# Patient Record
Sex: Male | Born: 1970
Health system: Southern US, Community
[De-identification: ages and names within clinical notes are randomized; demographics above are authoritative.]

## PROBLEM LIST (undated history)

## (undated) DIAGNOSIS — M519 Unspecified thoracic, thoracolumbar and lumbosacral intervertebral disc disorder: Secondary | ICD-10-CM

## (undated) DIAGNOSIS — E119 Type 2 diabetes mellitus without complications: Secondary | ICD-10-CM

## (undated) DIAGNOSIS — J45909 Unspecified asthma, uncomplicated: Secondary | ICD-10-CM

## (undated) DIAGNOSIS — E669 Obesity, unspecified: Secondary | ICD-10-CM

## (undated) DIAGNOSIS — I10 Essential (primary) hypertension: Secondary | ICD-10-CM

## (undated) HISTORY — DX: Unspecified thoracic, thoracolumbar and lumbosacral intervertebral disc disorder: M51.9

## (undated) HISTORY — DX: Type 2 diabetes mellitus without complications: E11.9

## (undated) HISTORY — DX: Obesity, unspecified: E66.9

## (undated) HISTORY — DX: Unspecified asthma, uncomplicated: J45.909

## (undated) HISTORY — DX: Essential (primary) hypertension: I10

## (undated) HISTORY — PX: EYE SURGERY: SHX253

---

## 2005-03-31 ENCOUNTER — Ambulatory Visit: Payer: Self-pay | Admitting: Family Medicine

## 2006-05-06 ENCOUNTER — Ambulatory Visit: Payer: Self-pay | Admitting: Family Medicine

## 2006-07-24 ENCOUNTER — Ambulatory Visit: Payer: Self-pay | Admitting: Family Medicine

## 2006-08-26 ENCOUNTER — Ambulatory Visit: Payer: Self-pay | Admitting: Family Medicine

## 2007-03-02 DIAGNOSIS — M519 Unspecified thoracic, thoracolumbar and lumbosacral intervertebral disc disorder: Secondary | ICD-10-CM

## 2007-03-02 HISTORY — DX: Unspecified thoracic, thoracolumbar and lumbosacral intervertebral disc disorder: M51.9

## 2007-05-12 ENCOUNTER — Ambulatory Visit: Payer: Self-pay | Admitting: Family Medicine

## 2007-05-12 LAB — CONVERTED CEMR LAB
Albumin: 4 g/dL (ref 3.5–5.2)
Basophils Absolute: 0 10*3/uL (ref 0.0–0.1)
Bilirubin, Direct: 0.1 mg/dL (ref 0.0–0.3)
Creatinine, Ser: 1.2 mg/dL (ref 0.4–1.5)
Direct LDL: 118.3 mg/dL
Eosinophils Absolute: 0.3 10*3/uL (ref 0.0–0.6)
HCT: 43.8 % (ref 39.0–52.0)
Hemoglobin: 14.9 g/dL (ref 13.0–17.0)
Hgb A1c MFr Bld: 5.1 % (ref 4.6–6.0)
MCHC: 33.9 g/dL (ref 30.0–36.0)
MCV: 93.1 fL (ref 78.0–100.0)
Monocytes Absolute: 0.8 10*3/uL — ABNORMAL HIGH (ref 0.2–0.7)
Neutrophils Relative %: 64.8 % (ref 43.0–77.0)
Potassium: 3.7 meq/L (ref 3.5–5.1)
Sodium: 136 meq/L (ref 135–145)
Total Bilirubin: 0.8 mg/dL (ref 0.3–1.2)

## 2007-05-27 ENCOUNTER — Encounter: Payer: Self-pay | Admitting: Family Medicine

## 2007-05-27 DIAGNOSIS — I10 Essential (primary) hypertension: Secondary | ICD-10-CM | POA: Insufficient documentation

## 2008-02-16 DIAGNOSIS — M5137 Other intervertebral disc degeneration, lumbosacral region: Secondary | ICD-10-CM | POA: Insufficient documentation

## 2008-02-18 ENCOUNTER — Ambulatory Visit: Payer: Self-pay | Admitting: Family Medicine

## 2008-02-21 ENCOUNTER — Ambulatory Visit: Payer: Self-pay | Admitting: Family Medicine

## 2008-02-28 ENCOUNTER — Ambulatory Visit: Payer: Self-pay | Admitting: Family Medicine

## 2008-03-07 ENCOUNTER — Encounter: Admission: RE | Admit: 2008-03-07 | Discharge: 2008-03-07 | Payer: Self-pay | Admitting: Family Medicine

## 2008-03-07 ENCOUNTER — Telehealth (INDEPENDENT_AMBULATORY_CARE_PROVIDER_SITE_OTHER): Payer: Self-pay | Admitting: *Deleted

## 2008-10-03 ENCOUNTER — Ambulatory Visit: Payer: Self-pay | Admitting: Family Medicine

## 2008-10-03 DIAGNOSIS — E663 Overweight: Secondary | ICD-10-CM | POA: Insufficient documentation

## 2009-05-30 ENCOUNTER — Ambulatory Visit: Payer: Self-pay | Admitting: Family Medicine

## 2009-05-30 DIAGNOSIS — J309 Allergic rhinitis, unspecified: Secondary | ICD-10-CM | POA: Insufficient documentation

## 2009-11-17 IMAGING — CR DG LUMBAR SPINE COMPLETE 4+V
6 series · 6 of 6 positions shown · non-contrast
Comparison: None

CLINICAL DATA: Low back pain radiating down left leg

LUMBAR SPINE - COMPLETE 4+ VIEW

[view not recorded (1 of 6)]
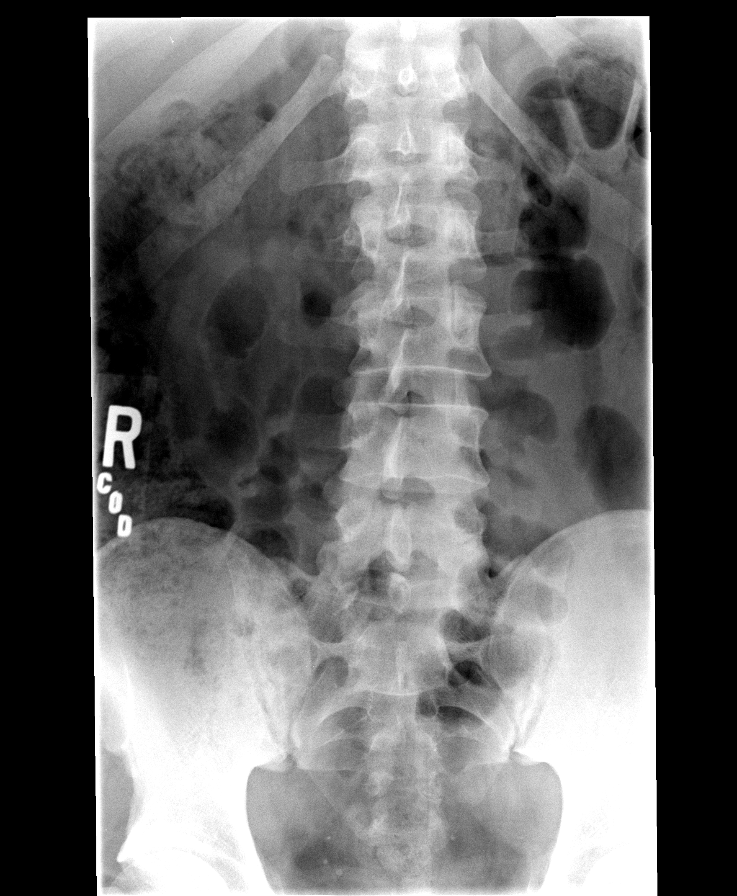

[view not recorded (2 of 6)]
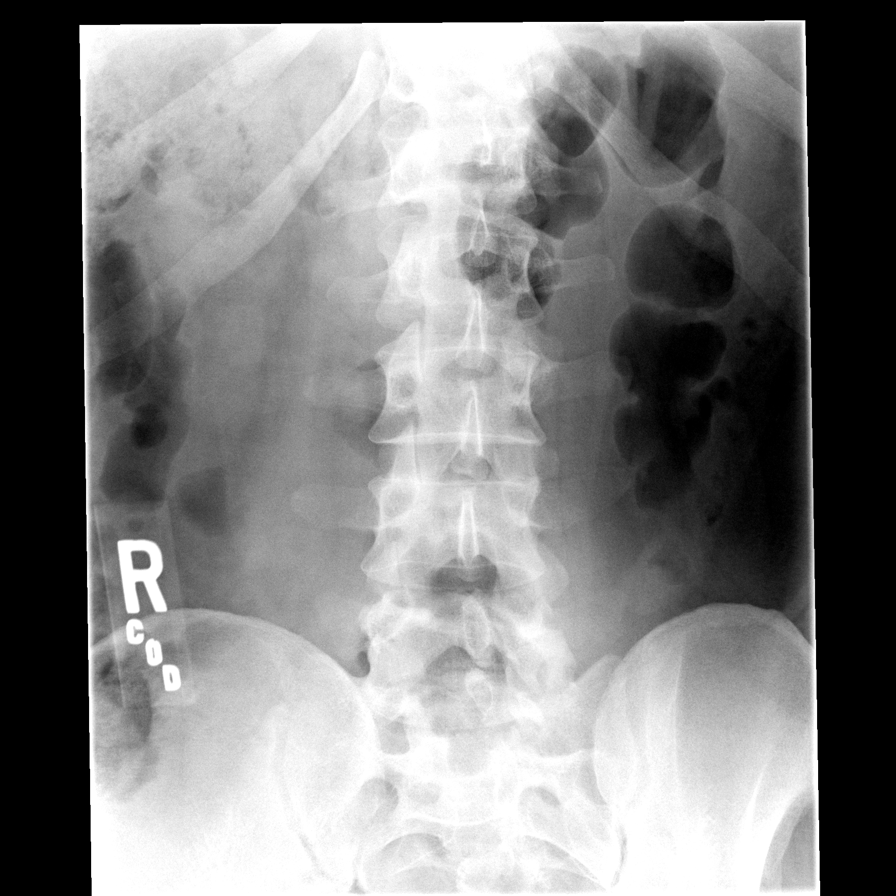

[view not recorded (3 of 6)]
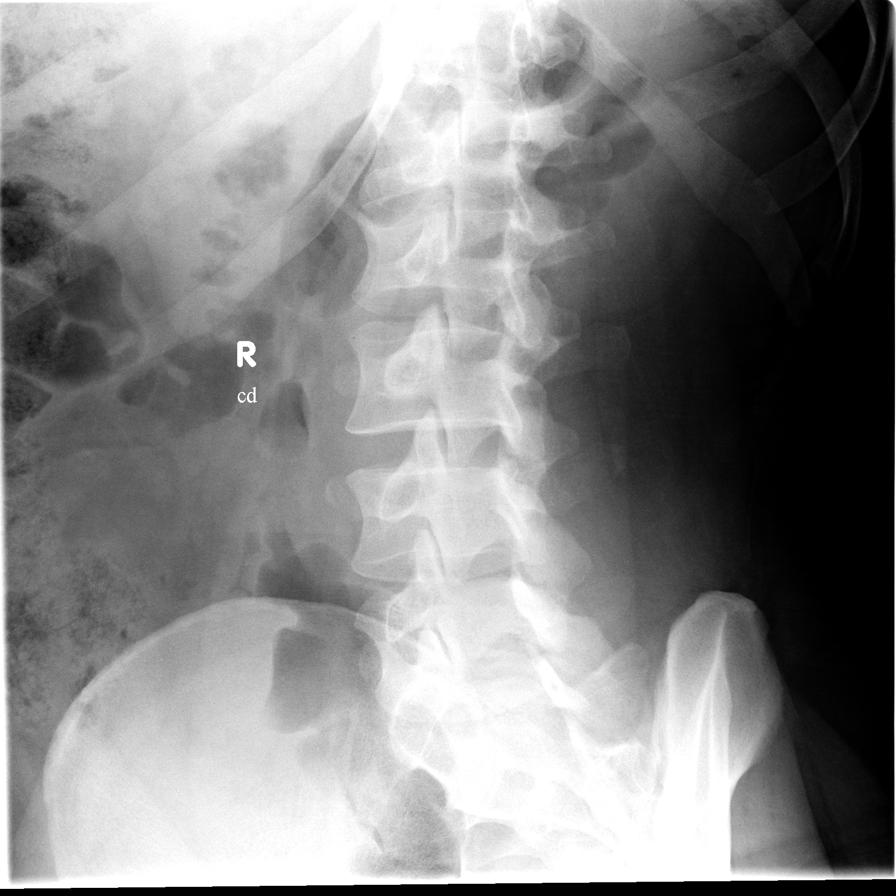

[view not recorded (4 of 6)]
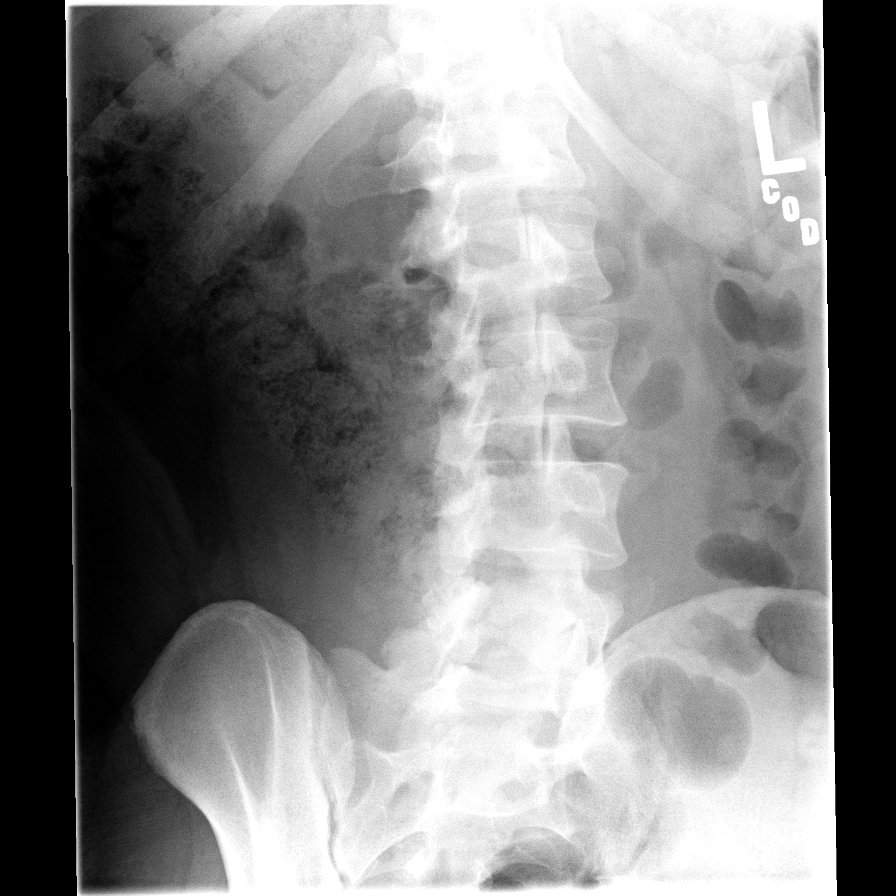

[view not recorded (5 of 6)]
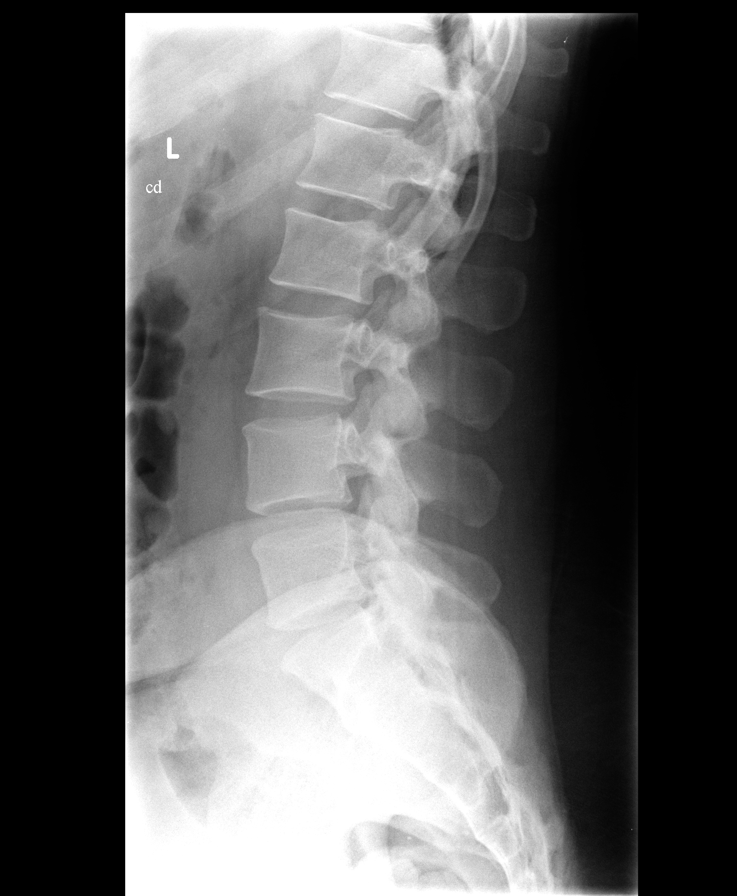

[view not recorded (6 of 6)]
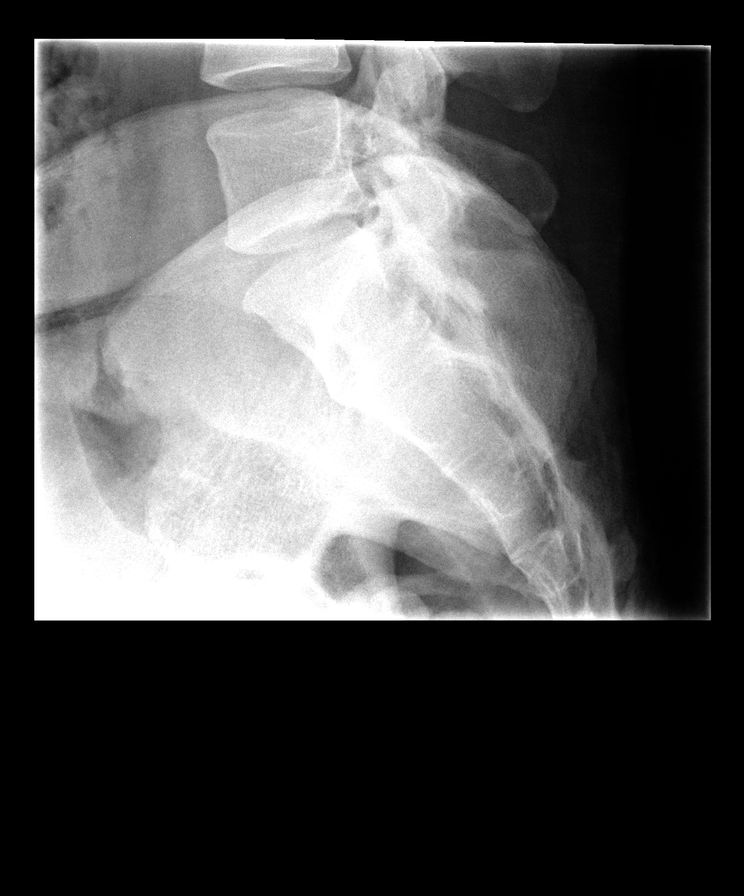

[6 of 6 positions shown; findings below may reference images not displayed]

FINDINGS: The lumbar vertebral are in normal alignment with normal
intervertebral disc spaces.  No compression deformity is seen.  No
significant degenerative change is noted.  The SI joints appear
normal.
IMPRESSION: Negative lumbar spine.  Normal alignment.

## 2010-01-04 ENCOUNTER — Ambulatory Visit: Payer: Self-pay | Admitting: Family Medicine

## 2010-12-29 LAB — CONVERTED CEMR LAB
ALT: 18 units/L (ref 0–53)
AST: 21 units/L (ref 0–37)
Basophils Relative: 0.7 % (ref 0.0–3.0)
CO2: 33 meq/L — ABNORMAL HIGH (ref 19–32)
Calcium: 9.2 mg/dL (ref 8.4–10.5)
Chloride: 101 meq/L (ref 96–112)
Cholesterol: 178 mg/dL (ref 0–200)
Creatinine, Ser: 1 mg/dL (ref 0.4–1.5)
Eosinophils Relative: 2.3 % (ref 0.0–5.0)
Glucose, Bld: 99 mg/dL (ref 70–99)
Hemoglobin: 15.2 g/dL (ref 13.0–17.0)
Hgb A1c MFr Bld: 4.9 % (ref 4.6–6.0)
LDL Cholesterol: 118 mg/dL — ABNORMAL HIGH (ref 0–99)
Lymphocytes Relative: 22.1 % (ref 12.0–46.0)
Monocytes Relative: 8.2 % (ref 3.0–12.0)
Neutro Abs: 6.1 10*3/uL (ref 1.4–7.7)
Nitrite: NEGATIVE
RBC: 4.72 M/uL (ref 4.22–5.81)
TSH: 0.88 microintl units/mL (ref 0.35–5.50)
Total CHOL/HDL Ratio: 5
Total Protein: 8.1 g/dL (ref 6.0–8.3)
Urobilinogen, UA: 4
WBC Urine, dipstick: NEGATIVE
WBC: 9.1 10*3/uL (ref 4.5–10.5)

## 2011-01-02 NOTE — Assessment & Plan Note (Signed)
Summary: cough/chest congestion/fever/per rachel/cjr   Vital Signs:  Patient profile:   40 year old male Weight:      318 pounds Temp:     98.8 degrees F oral BP sitting:   130 / 90  (left arm) Cuff size:   large  Vitals Entered By: Kern Reap CMA Duncan Dull) (January 04, 2010 3:05 PM)  Reason for Visit congestion for one week  History of Present Illness: Jared Conner is a 40 year old single male, nonsmoker, who works at the call.  [employer] who comes in today with a week's history of head congestion, sore throat, and cough.  12 point review of systems negative  Allergies: 1)  ! * Dairy  Past History:  Past medical history reviewed for relevance to current acute and chronic problems.  Past Medical History: Reviewed history from 10/03/2008 and no changes required. Hypertension OBESE ruptured disk, L5-S1 central April 2008 nonsurgical treatment  Social History: Reviewed history from 10/03/2008 and no changes required. Occupation: call center at [employer] Single Never Smoked Alcohol use-no Drug use-no Regular exercise-no  Review of Systems      See HPI  Physical Exam  General:  Well-developed,well-nourished,in no acute distress; alert,appropriate and cooperative throughout examination Head:  Normocephalic and atraumatic without obvious abnormalities. No apparent alopecia or balding. Eyes:  No corneal or conjunctival inflammation noted. EOMI. Perrla. Funduscopic exam benign, without hemorrhages, exudates or papilledema. Vision grossly normal. Ears:  External ear exam shows no significant lesions or deformities.  Otoscopic examination reveals clear canals, tympanic membranes are intact bilaterally without bulging, retraction, inflammation or discharge. Hearing is grossly normal bilaterally. Nose:  External nasal examination shows no deformity or inflammation. Nasal mucosa are pink and moist without lesions or exudates. Mouth:  Oral mucosa and oropharynx without  lesions or exudates.  Teeth in good repair. Neck:  No deformities, masses, or tenderness noted. Chest Wall:  No deformities, masses, tenderness or gynecomastia noted. Breasts:  No masses or gynecomastia noted Lungs:  Normal respiratory effort, chest expands symmetrically. Lungs are clear to auscultation, no crackles or wheezes.   Problems:  Medical Problems Added: 1)  Dx of Viral Infection-unspec  (ICD-079.99)  Impression & Recommendations:  Problem # 1:  VIRAL INFECTION-UNSPEC (ICD-079.99) Assessment New  His updated medication list for this problem includes:    Hydromet 5-1.5 Mg/77ml Syrp (Hydrocodone-homatropine) .Marland Kitchen... 1 or 2 tsps three times a day as needed  Complete Medication List: 1)  Hydrochlorothiazide 25 Mg Tabs (Hydrochlorothiazide) .... Once daily 2)  Zestril 10 Mg Tabs (Lisinopril) .... Take 1 tablet by mouth once a day 3)  Hydromet 5-1.5 Mg/29ml Syrp (Hydrocodone-homatropine) .Marland Kitchen.. 1 or 2 tsps three times a day as needed  Patient Instructions: 1)  drink 30 ounces of water daily, run a vaporizer or humidifier in your bedroom at night.  u  may take one or 2 teaspoons of Hydromet, up to 3 times a day as needed for cough and cold.  Return p.r.n. Prescriptions: HYDROMET 5-1.5 MG/5ML SYRP (HYDROCODONE-HOMATROPINE) 1 or 2 tsps three times a day as needed  #8oz x 1   Entered and Authorized by:   Roderick Pee MD   Signed by:   Roderick Pee MD on 01/04/2010   Method used:   Print then Give to Patient   RxID:   [id number]

## 2011-02-28 ENCOUNTER — Other Ambulatory Visit: Payer: Self-pay | Admitting: Family Medicine

## 2011-03-26 ENCOUNTER — Encounter: Payer: Self-pay | Admitting: Family Medicine

## 2011-03-27 ENCOUNTER — Encounter: Payer: Self-pay | Admitting: Family Medicine

## 2011-03-27 ENCOUNTER — Ambulatory Visit (INDEPENDENT_AMBULATORY_CARE_PROVIDER_SITE_OTHER): Payer: 59 | Admitting: Family Medicine

## 2011-03-27 VITALS — BP 110/78 | Temp 98.6°F | Ht 74.0 in

## 2011-03-27 DIAGNOSIS — I10 Essential (primary) hypertension: Secondary | ICD-10-CM

## 2011-03-27 NOTE — Progress Notes (Signed)
  Subjective:    Patient ID: Jared Conner, male    DOB: 18-Aug-1971, 40 y.o.   MRN: [id number]  HPICarlton is a 40 year old male, who comes in today for reevaluation of hypertension.  We started him on hydrochlorothiazide 25 mg daily and lisinopril 10 mg daily BP now down to 110/78.  No side effects from medication    Review of Systems    General and cardiovascular his systems otherwise negative Objective:   Physical Exam    Well-developed well-nourished, male in no acute distress    Assessment & Plan:  Hypertension under good control with current medication,,,,,,, BP check weekly,,,,,,,, return in August for your annual exam and sooner if any problems

## 2011-04-03 ENCOUNTER — Other Ambulatory Visit: Payer: Self-pay | Admitting: Family Medicine

## 2011-07-21 ENCOUNTER — Other Ambulatory Visit: Payer: 59

## 2011-07-22 ENCOUNTER — Other Ambulatory Visit: Payer: Self-pay | Admitting: Family Medicine

## 2011-07-28 ENCOUNTER — Encounter: Payer: 59 | Admitting: Family Medicine

## 2011-09-02 ENCOUNTER — Other Ambulatory Visit: Payer: 59

## 2011-09-09 ENCOUNTER — Encounter: Payer: 59 | Admitting: Family Medicine

## 2011-10-31 ENCOUNTER — Other Ambulatory Visit (INDEPENDENT_AMBULATORY_CARE_PROVIDER_SITE_OTHER): Payer: 59

## 2011-10-31 DIAGNOSIS — Z0000 Encounter for general adult medical examination without abnormal findings: Secondary | ICD-10-CM

## 2011-10-31 LAB — CBC WITH DIFFERENTIAL/PLATELET
Basophils Absolute: 0 10*3/uL (ref 0.0–0.1)
Basophils Relative: 0.5 % (ref 0.0–3.0)
Eosinophils Absolute: 0.2 10*3/uL (ref 0.0–0.7)
Lymphocytes Relative: 20 % (ref 12.0–46.0)
MCHC: 33.6 g/dL (ref 30.0–36.0)
MCV: 94.3 fl (ref 78.0–100.0)
Monocytes Absolute: 0.6 10*3/uL (ref 0.1–1.0)
Neutrophils Relative %: 71.2 % (ref 43.0–77.0)
Platelets: 299 10*3/uL (ref 150.0–400.0)
RBC: 4.24 Mil/uL (ref 4.22–5.81)
WBC: 9.6 10*3/uL (ref 4.5–10.5)

## 2011-10-31 LAB — BASIC METABOLIC PANEL
BUN: 12 mg/dL (ref 6–23)
CO2: 33 mEq/L — ABNORMAL HIGH (ref 19–32)
Calcium: 8.7 mg/dL (ref 8.4–10.5)
Creatinine, Ser: 1.1 mg/dL (ref 0.4–1.5)

## 2011-10-31 LAB — LIPID PANEL
Cholesterol: 190 mg/dL (ref 0–200)
HDL: 36.2 mg/dL — ABNORMAL LOW (ref >39.00)
LDL Cholesterol: 116 mg/dL — ABNORMAL HIGH (ref 0–99)
Total CHOL/HDL Ratio: 5
Triglycerides: 188 mg/dL — ABNORMAL HIGH (ref 0.0–149.0)
VLDL: 37.6 mg/dL (ref 0.0–40.0)

## 2011-10-31 LAB — HEPATIC FUNCTION PANEL
Alkaline Phosphatase: 59 U/L (ref 39–117)
Bilirubin, Direct: 0.1 mg/dL (ref 0.0–0.3)
Total Bilirubin: 0.7 mg/dL (ref 0.3–1.2)
Total Protein: 7.4 g/dL (ref 6.0–8.3)

## 2011-10-31 LAB — POCT URINALYSIS DIPSTICK
Blood, UA: NEGATIVE
Ketones, UA: NEGATIVE
Spec Grav, UA: 1.02
Urobilinogen, UA: >=8
pH, UA: 7

## 2011-11-18 ENCOUNTER — Ambulatory Visit (INDEPENDENT_AMBULATORY_CARE_PROVIDER_SITE_OTHER): Payer: 59 | Admitting: Family Medicine

## 2011-11-18 ENCOUNTER — Encounter: Payer: Self-pay | Admitting: Family Medicine

## 2011-11-18 DIAGNOSIS — I10 Essential (primary) hypertension: Secondary | ICD-10-CM

## 2011-11-18 DIAGNOSIS — R7301 Impaired fasting glucose: Secondary | ICD-10-CM

## 2011-11-18 DIAGNOSIS — J309 Allergic rhinitis, unspecified: Secondary | ICD-10-CM

## 2011-11-18 DIAGNOSIS — E663 Overweight: Secondary | ICD-10-CM

## 2011-11-18 MED ORDER — LISINOPRIL-HYDROCHLOROTHIAZIDE 10-12.5 MG PO TABS: 1.0000 | ORAL_TABLET | Freq: Every day | ORAL | Status: DC

## 2011-11-18 NOTE — Progress Notes (Signed)
  Subjective:    Patient ID: Jared Conner, male    DOB: 12-31-1970, 40 y.o.   MRN: [id number]  HPI Jared Conner is a 40 year old, married male, nonsmoker, who comes in today for evaluation of obesity, and hypertension.  His weight is 292 pounds.  He has a sedentary IT job with [employer] and does not exercise on a regular basis.  He was encouraged to start an exercise program.  He takes lisinopril 10 mg and 25 mg of HCTZ for hypertension.  BP 110/80   Review of Systems  Constitutional: Negative.   HENT: Negative.   Eyes: Negative.   Respiratory: Negative.   Cardiovascular: Negative.   Gastrointestinal: Negative.   Genitourinary: Negative.   Musculoskeletal: Negative.   Skin: Negative.   Neurological: Negative.   Hematological: Negative.   Psychiatric/Behavioral: Negative.        Objective:   Physical Exam  Constitutional: He is oriented to person, place, and time. He appears well-developed and well-nourished.  HENT:  Head: Normocephalic and atraumatic.  Right Ear: External ear normal.  Left Ear: External ear normal.  Nose: Nose normal.  Mouth/Throat: Oropharynx is clear and moist.  Eyes: Conjunctivae and EOM are normal. Pupils are equal, round, and reactive to light.  Neck: Normal range of motion. Neck supple. No JVD present. No tracheal deviation present. No thyromegaly present.  Cardiovascular: Normal rate, regular rhythm, normal heart sounds and intact distal pulses.  Exam reveals no gallop and no friction rub.   No murmur heard. Pulmonary/Chest: Effort normal and breath sounds normal. No stridor. No respiratory distress. He has no wheezes. He has no rales. He exhibits no tenderness.  Abdominal: Soft. Bowel sounds are normal. He exhibits no distension and no mass. There is no tenderness. There is no rebound and no guarding.  Genitourinary: Rectum normal, prostate normal and penis normal. Guaiac negative stool. No penile tenderness.  Musculoskeletal: Normal range of motion. He  exhibits no edema and no tenderness.  Lymphadenopathy:    He has no cervical adenopathy.  Neurological: He is alert and oriented to person, place, and time. He has normal reflexes. No cranial nerve deficit. He exhibits normal muscle tone.  Skin: Skin is warm and dry. No rash noted. No erythema. No pallor.  Psychiatric: He has a normal mood and affect. His behavior is normal. Judgment and thought content normal.          Assessment & Plan:  Healthy male.  Obesity.  Recommend diet and exercise and weight loss program.  Hypertension.  It goal continue current therapy.  Follow-up in one year, sooner if any problems

## 2011-11-18 NOTE — Patient Instructions (Addendum)
The new blood pressure medication has both undergo a thiazide and the lisinopril and one pill............... Therefore take one tablet daily in the morning.  Return in one year, sooner if any problems.  strongly consider a diet, exercise and weight loss program For your overall health plus, your blood sugar is now elevated  Return in June for follow-up fasting labs one week prior

## 2012-05-11 ENCOUNTER — Other Ambulatory Visit: Payer: 59

## 2012-05-18 ENCOUNTER — Ambulatory Visit: Payer: 59 | Admitting: Family Medicine

## 2012-06-10 ENCOUNTER — Other Ambulatory Visit: Payer: 59

## 2012-06-17 ENCOUNTER — Ambulatory Visit: Payer: 59 | Admitting: Family Medicine

## 2012-08-09 ENCOUNTER — Ambulatory Visit: Payer: 59 | Admitting: Family Medicine

## 2012-08-17 ENCOUNTER — Ambulatory Visit: Payer: 59 | Admitting: Family Medicine

## 2012-08-26 ENCOUNTER — Other Ambulatory Visit (INDEPENDENT_AMBULATORY_CARE_PROVIDER_SITE_OTHER): Payer: 59

## 2012-08-26 DIAGNOSIS — R7301 Impaired fasting glucose: Secondary | ICD-10-CM

## 2012-08-26 LAB — BASIC METABOLIC PANEL
BUN: 13 mg/dL (ref 6–23)
CO2: 29 mEq/L (ref 19–32)
Calcium: 9.2 mg/dL (ref 8.4–10.5)
GFR: 106.53 mL/min (ref >60.00)
Glucose, Bld: 104 mg/dL — ABNORMAL HIGH (ref 70–99)
Sodium: 138 mEq/L (ref 135–145)

## 2012-08-26 LAB — MICROALBUMIN / CREATININE URINE RATIO: Creatinine,U: 168.1 mg/dL

## 2012-09-02 ENCOUNTER — Encounter: Payer: Self-pay | Admitting: Family Medicine

## 2012-09-02 ENCOUNTER — Ambulatory Visit (INDEPENDENT_AMBULATORY_CARE_PROVIDER_SITE_OTHER): Payer: 59 | Admitting: Family Medicine

## 2012-09-02 VITALS — BP 124/90 | Temp 98.5°F | Wt 369.0 lb

## 2012-09-02 DIAGNOSIS — R7302 Impaired glucose tolerance (oral): Secondary | ICD-10-CM

## 2012-09-02 DIAGNOSIS — E663 Overweight: Secondary | ICD-10-CM

## 2012-09-02 DIAGNOSIS — R7309 Other abnormal glucose: Secondary | ICD-10-CM

## 2012-09-02 DIAGNOSIS — I10 Essential (primary) hypertension: Secondary | ICD-10-CM

## 2012-09-02 MED ORDER — LISINOPRIL-HYDROCHLOROTHIAZIDE 10-12.5 MG PO TABS: 1.0000 | ORAL_TABLET | Freq: Every day | ORAL | Status: DC

## 2012-09-02 NOTE — Progress Notes (Signed)
  Subjective:    Patient ID: Jared Conner, male    DOB: February 07, 1971, 41 y.o.   MRN: [id number]  HPI Jared Conner is a 41 year old male who comes in today for evaluation of multiple issues  He's on Zestoretic 10-12.5 daily BP 124/90  He has had a history of glucose intolerance his fasting blood sugar was 104 A1c normal 5.4 however 3 years ago was 4.9.  His weight today is up to 369 pounds. He states that our scales or 20 pounds heavier than his home scales. He's been trying to exercise by walking 30-60 minutes twice weekly  Last physical examination December 2012   Review of Systems    general and metabolic review of systems otherwise negative Objective:   Physical Exam  Well-developed and nourished man no acute distress BP right arm sitting position 124/90      Assessment & Plan:  Healthy male  Hypertension continue current medication  Glucose intolerance continued attention to diet and exercise

## 2012-09-02 NOTE — Patient Instructions (Signed)
Continue your blood pressure medication daily  Work hard on the diet and exercise program  Return in April for general physical examination  Labs one week prior

## 2013-02-24 ENCOUNTER — Other Ambulatory Visit: Payer: 59

## 2013-03-03 ENCOUNTER — Encounter: Payer: 59 | Admitting: Family Medicine

## 2013-05-17 ENCOUNTER — Other Ambulatory Visit: Payer: 59

## 2013-05-24 ENCOUNTER — Encounter: Payer: 59 | Admitting: Family Medicine

## 2013-07-12 ENCOUNTER — Other Ambulatory Visit: Payer: 59

## 2013-07-19 ENCOUNTER — Encounter: Payer: 59 | Admitting: Family Medicine

## 2013-09-02 ENCOUNTER — Other Ambulatory Visit: Payer: 59

## 2013-09-06 ENCOUNTER — Encounter: Payer: 59 | Admitting: Family Medicine

## 2013-09-29 ENCOUNTER — Other Ambulatory Visit: Payer: Self-pay | Admitting: Family Medicine

## 2013-11-07 ENCOUNTER — Other Ambulatory Visit: Payer: 59

## 2013-11-14 ENCOUNTER — Encounter: Payer: 59 | Admitting: Family Medicine

## 2014-01-19 ENCOUNTER — Other Ambulatory Visit: Payer: Self-pay | Admitting: Family Medicine

## 2014-03-08 ENCOUNTER — Other Ambulatory Visit: Payer: 59

## 2014-03-15 ENCOUNTER — Encounter: Payer: 59 | Admitting: Family Medicine

## 2014-04-21 ENCOUNTER — Other Ambulatory Visit: Payer: 59

## 2014-05-03 ENCOUNTER — Encounter: Payer: 59 | Admitting: Family Medicine

## 2014-05-08 ENCOUNTER — Other Ambulatory Visit: Payer: Self-pay | Admitting: Family Medicine

## 2014-05-14 ENCOUNTER — Other Ambulatory Visit: Payer: Self-pay | Admitting: Family Medicine

## 2014-06-19 ENCOUNTER — Other Ambulatory Visit (INDEPENDENT_AMBULATORY_CARE_PROVIDER_SITE_OTHER): Payer: 59

## 2014-06-19 DIAGNOSIS — Z0000 Encounter for general adult medical examination without abnormal findings: Secondary | ICD-10-CM

## 2014-06-19 LAB — POCT URINALYSIS DIPSTICK
BILIRUBIN UA: NEGATIVE
Blood, UA: NEGATIVE
Glucose, UA: NEGATIVE
KETONES UA: NEGATIVE
LEUKOCYTES UA: NEGATIVE
NITRITE UA: NEGATIVE
PH UA: 7
Protein, UA: NEGATIVE
Spec Grav, UA: 1.02
Urobilinogen, UA: 2

## 2014-06-19 LAB — CBC WITH DIFFERENTIAL/PLATELET
BASOS ABS: 0 10*3/uL (ref 0.0–0.1)
Basophils Relative: 0.3 % (ref 0.0–3.0)
Eosinophils Absolute: 0.3 10*3/uL (ref 0.0–0.7)
Eosinophils Relative: 2.8 % (ref 0.0–5.0)
HEMATOCRIT: 40.5 % (ref 39.0–52.0)
HEMOGLOBIN: 13.5 g/dL (ref 13.0–17.0)
Lymphocytes Relative: 18.2 % (ref 12.0–46.0)
Lymphs Abs: 2 10*3/uL (ref 0.7–4.0)
MCHC: 33.4 g/dL (ref 30.0–36.0)
MCV: 93.7 fl (ref 78.0–100.0)
MONO ABS: 0.9 10*3/uL (ref 0.1–1.0)
MONOS PCT: 8 % (ref 3.0–12.0)
NEUTROS ABS: 7.9 10*3/uL — AB (ref 1.4–7.7)
Neutrophils Relative %: 70.7 % (ref 43.0–77.0)
PLATELETS: 323 10*3/uL (ref 150.0–400.0)
RBC: 4.32 Mil/uL (ref 4.22–5.81)
RDW: 13 % (ref 11.5–15.5)
WBC: 11.2 10*3/uL — ABNORMAL HIGH (ref 4.0–10.5)

## 2014-06-19 LAB — HEPATIC FUNCTION PANEL
ALT: 17 U/L (ref 0–53)
AST: 16 U/L (ref 0–37)
Albumin: 3.8 g/dL (ref 3.5–5.2)
Alkaline Phosphatase: 62 U/L (ref 39–117)
BILIRUBIN TOTAL: 0.3 mg/dL (ref 0.2–1.2)
Bilirubin, Direct: 0.1 mg/dL (ref 0.0–0.3)
Total Protein: 7.3 g/dL (ref 6.0–8.3)

## 2014-06-19 LAB — PSA: PSA: 0.28 ng/mL (ref 0.10–4.00)

## 2014-06-19 LAB — BASIC METABOLIC PANEL
BUN: 11 mg/dL (ref 6–23)
CHLORIDE: 97 meq/L (ref 96–112)
CO2: 30 mEq/L (ref 19–32)
Calcium: 8.6 mg/dL (ref 8.4–10.5)
Creatinine, Ser: 1.1 mg/dL (ref 0.4–1.5)
GFR: 96.56 mL/min (ref >60.00)
GLUCOSE: 190 mg/dL — AB (ref 70–99)
Potassium: 4 mEq/L (ref 3.5–5.1)
SODIUM: 135 meq/L (ref 135–145)

## 2014-06-19 LAB — LIPID PANEL
CHOLESTEROL: 161 mg/dL (ref 0–200)
HDL: 32.5 mg/dL — ABNORMAL LOW (ref >39.00)
LDL Cholesterol: 71 mg/dL (ref 0–99)
NONHDL: 128.5
Total CHOL/HDL Ratio: 5
Triglycerides: 289 mg/dL — ABNORMAL HIGH (ref 0.0–149.0)
VLDL: 57.8 mg/dL — AB (ref 0.0–40.0)

## 2014-06-19 LAB — TSH: TSH: 1.99 u[IU]/mL (ref 0.35–4.50)

## 2014-06-26 ENCOUNTER — Encounter: Payer: 59 | Admitting: Family Medicine

## 2014-07-13 ENCOUNTER — Encounter: Payer: 59 | Admitting: Family Medicine

## 2014-08-03 ENCOUNTER — Ambulatory Visit (INDEPENDENT_AMBULATORY_CARE_PROVIDER_SITE_OTHER): Payer: 59 | Admitting: Family Medicine

## 2014-08-03 ENCOUNTER — Encounter: Payer: Self-pay | Admitting: Family Medicine

## 2014-08-03 VITALS — BP 120/78 | Temp 98.7°F | Ht 74.0 in | Wt 377.0 lb

## 2014-08-03 DIAGNOSIS — M5137 Other intervertebral disc degeneration, lumbosacral region: Secondary | ICD-10-CM

## 2014-08-03 DIAGNOSIS — I10 Essential (primary) hypertension: Secondary | ICD-10-CM

## 2014-08-03 DIAGNOSIS — M51379 Other intervertebral disc degeneration, lumbosacral region without mention of lumbar back pain or lower extremity pain: Secondary | ICD-10-CM

## 2014-08-03 DIAGNOSIS — E139 Other specified diabetes mellitus without complications: Secondary | ICD-10-CM | POA: Insufficient documentation

## 2014-08-03 DIAGNOSIS — IMO0001 Reserved for inherently not codable concepts without codable children: Secondary | ICD-10-CM

## 2014-08-03 DIAGNOSIS — Z23 Encounter for immunization: Secondary | ICD-10-CM

## 2014-08-03 DIAGNOSIS — E1165 Type 2 diabetes mellitus with hyperglycemia: Secondary | ICD-10-CM

## 2014-08-03 DIAGNOSIS — E663 Overweight: Secondary | ICD-10-CM

## 2014-08-03 LAB — HEMOGLOBIN A1C: HEMOGLOBIN A1C: 7.2 % — AB (ref 4.6–6.5)

## 2014-08-03 LAB — MICROALBUMIN / CREATININE URINE RATIO
CREATININE, U: 191.1 mg/dL
MICROALB UR: 0.9 mg/dL (ref 0.0–1.9)
Microalb Creat Ratio: 0.5 mg/g (ref 0.0–30.0)

## 2014-08-03 MED ORDER — METFORMIN HCL 500 MG PO TABS: ORAL_TABLET | ORAL | Status: DC

## 2014-08-03 MED ORDER — LISINOPRIL 10 MG PO TABS: 10.0000 mg | ORAL_TABLET | Freq: Every day | ORAL | Status: DC

## 2014-08-03 NOTE — Progress Notes (Signed)
   Subjective:    Patient ID: Jared Conner, male    DOB: 14-Aug-1971, 43 y.o.   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 Also his weight is up to 377 pounds. He's not been in the gym for 6 months. He would like a reevaluation of his back. Recommended Dr. Danielle Dess  He gets routine eye care, dental care, colonoscopy at age 44......... no family history colon cancer or polyps...Marland KitchenMarland KitchenMarland Kitchen mother alive and well an 63 no medical problems.......... diabetic complications of diabetes.   Review of Systems  Constitutional: Negative.   HENT: Negative.   Eyes: Negative.   Respiratory: Negative.   Cardiovascular: Negative.   Gastrointestinal: Negative.   Genitourinary: Negative.   Musculoskeletal: Negative.   Skin: Negative.   Neurological: Negative.   Psychiatric/Behavioral: Negative.        Objective:   Physical Exam  Nursing note and vitals reviewed. Constitutional: He is oriented to person, place, and time. He appears well-developed and well-nourished.  HENT:  Head: Normocephalic and atraumatic.  Right Ear: External ear normal.  Left Ear: External ear normal.  Nose: Nose normal.  Mouth/Throat: Oropharynx is clear and moist.  Eyes: Conjunctivae and EOM are normal. Pupils are equal, round, and reactive to light.  Neck: Normal range of motion. Neck supple. No JVD present. No tracheal deviation present. No thyromegaly present.  Cardiovascular: Normal rate, regular rhythm, normal heart sounds and intact distal pulses.  Exam reveals no gallop and no friction rub.   No murmur heard. Pulmonary/Chest: Effort normal and breath sounds normal. No stridor. No respiratory distress. He has no wheezes. He has no rales. He exhibits no tenderness.    Abdominal: Soft. Bowel sounds are normal. He exhibits no distension and no mass. There is no tenderness. There is no rebound and no guarding.  Genitourinary: Rectum normal, prostate normal and penis normal. Guaiac negative stool. No penile tenderness.  Musculoskeletal: Normal range of motion. He exhibits no edema and no tenderness.  Lymphadenopathy:    He has no cervical adenopathy.  Neurological: He is alert and oriented to person, place, and time. He has normal reflexes. No cranial nerve deficit. He exhibits normal muscle tone.  2+ peripheral edema  Skin: Skin is warm and dry. No rash noted. No erythema. No pallor.  Psychiatric: He has a normal mood and affect. His behavior is normal. Judgment and thought content normal.          Assessment & Plan:Obesity... obesity  ..... Obesity......... diet exercise and weight loss  Hypertension at goal continue Zestoretic 20-12.5 daily  Peripheral edema....... secondary to obesity.......... add Lasix followup in 4 weeks.Marland KitchenMarland KitchenMarland Kitchen

## 2014-08-03 NOTE — Patient Instructions (Addendum)
Metformin 500 mg........ one half tab twice daily  No carbohydrates nor fat  Walk 30 minutes daily  Check a fasting blood sugar daily in the morning  Followup in one week  Lasix 20 mg........Marland Kitchen 1 daily in the morning Lisinopril 10 mg.......Marland Kitchen 1 daily in the morning  Call Dr. Barnett Abu neurosurgeon for consultation about your back  Stop  the Zestoretic

## 2014-08-03 NOTE — Progress Notes (Signed)
Pre visit review using our clinic review tool, if applicable. No additional management support is needed unless otherwise documented below in the visit note. 

## 2014-08-10 ENCOUNTER — Encounter: Payer: Self-pay | Admitting: Family Medicine

## 2014-08-10 ENCOUNTER — Ambulatory Visit (INDEPENDENT_AMBULATORY_CARE_PROVIDER_SITE_OTHER): Payer: 59 | Admitting: Family Medicine

## 2014-08-10 VITALS — BP 110/80 | Temp 98.4°F | Wt 379.0 lb

## 2014-08-10 DIAGNOSIS — E1165 Type 2 diabetes mellitus with hyperglycemia: Secondary | ICD-10-CM

## 2014-08-10 DIAGNOSIS — H6121 Impacted cerumen, right ear: Secondary | ICD-10-CM

## 2014-08-10 DIAGNOSIS — H6120 Impacted cerumen, unspecified ear: Secondary | ICD-10-CM | POA: Insufficient documentation

## 2014-08-10 DIAGNOSIS — H918X9 Other specified hearing loss, unspecified ear: Secondary | ICD-10-CM

## 2014-08-10 DIAGNOSIS — IMO0001 Reserved for inherently not codable concepts without codable children: Secondary | ICD-10-CM

## 2014-08-10 MED ORDER — ACCU-CHEK SOFTCLIX LANCET DEV MISC: Status: DC

## 2014-08-10 MED ORDER — GLUCOSE BLOOD VI STRP: ORAL_STRIP | Status: DC

## 2014-08-10 NOTE — Progress Notes (Signed)
Pre visit review using our clinic review tool, if applicable. No additional management support is needed unless otherwise documented below in the visit note. Lab Results  Component Value Date   HGBA1C 7.2* 08/03/2014   HGBA1C 5.4 08/26/2012   HGBA1C 4.9 10/03/2008   Lab Results  Component Value Date   MICROALBUR 0.9 08/03/2014   LDLCALC 71 06/19/2014   CREATININE 1.1 06/19/2014

## 2014-08-10 NOTE — Patient Instructions (Signed)
Continue current medications  Check a fasting blood sugar daily in the morning  Return to see me in 4 weeks for followup

## 2014-08-10 NOTE — Progress Notes (Signed)
   Subjective:    Patient ID: Jared Conner, male    DOB: 1971-04-29, 43 y.o.   MRN: [id number]  HPI  Jared Conner is a delightful 43 year old male who comes in today for followup of induction of metformin therapy for new-onset diabetes  We started him on metformin 250 mg twice a day blood sugars in the 120 to 1:30 range in the morning. No hypoglycemia  He also has hearing loss in his right ear he's been using the ear drops to no avail. He has a cerumen impaction  Review of Systems    review of systems negative Objective:   Physical Exam  Well-developed well-nourished male in no acute distress vital signs stable he is afebrile  Cerumen impaction right ear.........Marland Kitchen relieved by suction and irrigation      Assessment & Plan:  Diabetes type 2.......... new-onset......... no complication from therapy........ continue metformin 250 mg twice a day, diet and exercise, nutrition consults coming up  Followup with me in 4 weeks  Cerumen impaction right ear.......Marland Kitchen removed by irrigation.Marland Kitchen

## 2014-08-17 ENCOUNTER — Encounter: Payer: 59 | Attending: Family Medicine

## 2014-08-17 VITALS — Ht 73.0 in | Wt 376.9 lb

## 2014-08-17 DIAGNOSIS — Z713 Dietary counseling and surveillance: Secondary | ICD-10-CM | POA: Insufficient documentation

## 2014-08-17 DIAGNOSIS — E119 Type 2 diabetes mellitus without complications: Secondary | ICD-10-CM | POA: Insufficient documentation

## 2014-08-18 NOTE — Progress Notes (Signed)

## 2014-08-24 DIAGNOSIS — E119 Type 2 diabetes mellitus without complications: Secondary | ICD-10-CM | POA: Diagnosis not present

## 2014-08-25 NOTE — Progress Notes (Signed)

## 2014-08-31 ENCOUNTER — Encounter: Payer: 59 | Attending: Family Medicine

## 2014-08-31 DIAGNOSIS — E119 Type 2 diabetes mellitus without complications: Secondary | ICD-10-CM | POA: Insufficient documentation

## 2014-08-31 DIAGNOSIS — Z713 Dietary counseling and surveillance: Secondary | ICD-10-CM | POA: Insufficient documentation

## 2014-09-07 ENCOUNTER — Ambulatory Visit (INDEPENDENT_AMBULATORY_CARE_PROVIDER_SITE_OTHER): Payer: 59 | Admitting: Family Medicine

## 2014-09-07 ENCOUNTER — Encounter: Payer: Self-pay | Admitting: Family Medicine

## 2014-09-07 VITALS — BP 110/76 | Temp 98.5°F | Wt 369.5 lb

## 2014-09-07 DIAGNOSIS — E139 Other specified diabetes mellitus without complications: Secondary | ICD-10-CM

## 2014-09-07 NOTE — Progress Notes (Signed)
   Subjective:    Patient ID: Jared HessCarlton E Conner, male    DOB: 05/13/1971, 43 y.o.   MRN: [id number]  HPI Jared FlesherCarleton is a 43 year old male who comes in today because of new-onset diabetes  He's now on metformin 250 mg twice a day diet and exercise and has lost 10 pounds. Fasting sugars in the morning range from 100-109. No hypoglycemia  On lisinopril 10 mg BP 110/76  He's going to the diabetic nutrition counseling center.   Review of Systems    review of systems otherwise negative Objective:   Physical Exam  Well-developed and nourished male no acute distress vital signs stable he is afebrile weight down to 396 pounds BP 110/76      Assessment & Plan:  Diabetes type 2 at goal continue current therapy  Hypertension ago continue current therapy  Obesity continue diet exercise and weight loss  Followup A1c as outlined

## 2014-09-07 NOTE — Progress Notes (Signed)
Pre visit review using our clinic review tool, if applicable. No additional management support is needed unless otherwise documented below in the visit note. 

## 2014-09-07 NOTE — Patient Instructions (Signed)
Continue your current medications diet and exercise program  If you see consistently low blood sugars below 80 in the morning,,,,,,,,,, stop the evening dose of metformin but continue the morning to.  Followup in 2 months  Nonfasting labs,,,,,,,,,,, 11 AM or 4 PM,,,,,,, one week prior

## 2014-09-13 NOTE — Progress Notes (Signed)
Patient was seen on 08/31/14 for the third of a series of three diabetes self-management courses at the [facility]. The following learning objectives were met by the patient during this class:    State the amount of activity recommended for healthy living   Describe activities suitable for individual needs   Identify ways to regularly incorporate activity into daily life   Identify barriers to activity and ways to over come these barriers  Identify diabetes medications being personally used and their primary action for lowering glucose and possible side effects   Describe role of stress on blood glucose and develop strategies to address psychosocial issues   Identify diabetes complications and ways to prevent them  Explain how to manage diabetes during illness   Evaluate success in meeting personal goal   Establish 2-3 goals that they will plan to diligently work on until they return for the  104-monthfollow-up visit  Goals:   I will count my carb choices at most meals and snacks  I will be active 30 minutes or more 5 times a week  I will take my diabetes medications as scheduled  I will test my glucose at least 1 times a day, 7 days a week  Your patient has identified these potential barriers to change:  Motivation......"but I am working on it"  Your patient has identified their diabetes self-care support plan as  Family Support On-line Resources  Plan:  Attend Core 4 in 4 months

## 2014-10-25 ENCOUNTER — Other Ambulatory Visit (INDEPENDENT_AMBULATORY_CARE_PROVIDER_SITE_OTHER): Payer: 59

## 2014-10-25 DIAGNOSIS — I10 Essential (primary) hypertension: Secondary | ICD-10-CM

## 2014-10-25 DIAGNOSIS — E119 Type 2 diabetes mellitus without complications: Secondary | ICD-10-CM

## 2014-10-25 LAB — BASIC METABOLIC PANEL
BUN: 15 mg/dL (ref 6–23)
CALCIUM: 9.4 mg/dL (ref 8.4–10.5)
CO2: 27 mEq/L (ref 19–32)
CREATININE: 1.1 mg/dL (ref 0.50–1.35)
Chloride: 103 mEq/L (ref 96–112)
Glucose, Bld: 82 mg/dL (ref 70–99)
Potassium: 4.2 mEq/L (ref 3.5–5.3)
Sodium: 140 mEq/L (ref 135–145)

## 2014-10-26 LAB — HEMOGLOBIN A1C
HEMOGLOBIN A1C: 5.2 % (ref <5.7)
Mean Plasma Glucose: 103 mg/dL (ref <117)

## 2014-11-01 ENCOUNTER — Ambulatory Visit (INDEPENDENT_AMBULATORY_CARE_PROVIDER_SITE_OTHER): Payer: 59 | Admitting: Family Medicine

## 2014-11-01 ENCOUNTER — Encounter: Payer: Self-pay | Admitting: Family Medicine

## 2014-11-01 VITALS — BP 130/90 | Temp 98.5°F | Wt 362.0 lb

## 2014-11-01 DIAGNOSIS — E139 Other specified diabetes mellitus without complications: Secondary | ICD-10-CM

## 2014-11-01 NOTE — Progress Notes (Signed)
   Subjective:    Patient ID: Glean HessCarlton E Strohmeyer, male    DOB: 1971-01-30, 43 y.o.   MRN: [id number]  HPI Les PouCarlton is a 43 year old male who comes in today for evaluation diabetes  His A1c is gone over 7 in August. We increased his metformin due to 150 mg twice a day and emphasize diet exercise and weight loss. He's lost 10 pounds. A1c is down to 5.2% and fasting sugars are in the 80-100 range.  On 10 mg of lisinopril days BP today is 130/90. He'll monitor his blood pressure at home. If his diastolics remain above 90 them when increased his lisinopril to 20 mg daily   Review of Systems Review of systems otherwise negative    Objective:   Physical Exam  Well-developed well-nourished male no acute distress weight down to 362 pounds BP right arm sitting position 130/90      Assessment & Plan:  Diabetes type 2 at goal.....Marland Kitchen. continue current therapy follow-up in 3 months  Hypertension... Question goal....... BP check every morning follow-up in 2 weeks  Overweight........Marland Kitchen

## 2014-11-01 NOTE — Progress Notes (Signed)
Pre visit review using our clinic review tool, if applicable. No additional management support is needed unless otherwise documented below in the visit note. Lab Results  Component Value Date   HGBA1C 5.2 10/25/2014   HGBA1C 7.2* 08/03/2014   HGBA1C 5.4 08/26/2012   Lab Results  Component Value Date   MICROALBUR 0.9 08/03/2014   LDLCALC 71 06/19/2014   CREATININE 1.10 10/25/2014

## 2014-11-01 NOTE — Patient Instructions (Addendum)
Continue current medication  Continue diet and exercise program  Follow-up in 3 months  Nonfasting labs one week prior  Check your blood pressure daily in the morning for 2 weeks....... BP goal 135/85 or less..... If your blood pressure diastolic continues to be above 85 then increase your lisinopril to 20 mg daily call and leave a voicemail with Fleet ContrasRachel so we can increase your prescription

## 2014-12-04 ENCOUNTER — Telehealth: Payer: Self-pay | Admitting: Family Medicine

## 2014-12-04 NOTE — Telephone Encounter (Signed)
Pt would like to bring his bp machine in  and compare reading to our machine.

## 2014-12-05 NOTE — Telephone Encounter (Signed)
Left message on machine for patient to call back and bring his machine on Friday.

## 2014-12-07 NOTE — Telephone Encounter (Signed)
S/w pt he will come in 12/08/14

## 2014-12-08 ENCOUNTER — Ambulatory Visit (INDEPENDENT_AMBULATORY_CARE_PROVIDER_SITE_OTHER): Payer: 59 | Admitting: *Deleted

## 2014-12-08 ENCOUNTER — Encounter: Payer: Self-pay | Admitting: *Deleted

## 2014-12-08 VITALS — BP 130/94

## 2014-12-08 DIAGNOSIS — E139 Other specified diabetes mellitus without complications: Secondary | ICD-10-CM

## 2014-12-27 ENCOUNTER — Other Ambulatory Visit: Payer: 59

## 2015-01-01 ENCOUNTER — Ambulatory Visit: Payer: 59

## 2015-01-25 ENCOUNTER — Other Ambulatory Visit: Payer: 59

## 2015-01-31 ENCOUNTER — Other Ambulatory Visit (INDEPENDENT_AMBULATORY_CARE_PROVIDER_SITE_OTHER): Payer: 59

## 2015-01-31 DIAGNOSIS — E139 Other specified diabetes mellitus without complications: Secondary | ICD-10-CM

## 2015-01-31 LAB — BASIC METABOLIC PANEL
BUN: 16 mg/dL (ref 6–23)
CO2: 30 mEq/L (ref 19–32)
Calcium: 9.4 mg/dL (ref 8.4–10.5)
Chloride: 104 mEq/L (ref 96–112)
Creatinine, Ser: 1 mg/dL (ref 0.40–1.50)
GFR: 104.11 mL/min (ref >60.00)
Glucose, Bld: 86 mg/dL (ref 70–99)
POTASSIUM: 4.2 meq/L (ref 3.5–5.1)
SODIUM: 139 meq/L (ref 135–145)

## 2015-01-31 LAB — HEMOGLOBIN A1C: HEMOGLOBIN A1C: 4.7 % (ref 4.6–6.5)

## 2015-02-01 ENCOUNTER — Ambulatory Visit: Payer: 59 | Admitting: Family Medicine

## 2015-02-07 ENCOUNTER — Encounter: Payer: Self-pay | Admitting: Family Medicine

## 2015-02-07 ENCOUNTER — Ambulatory Visit (INDEPENDENT_AMBULATORY_CARE_PROVIDER_SITE_OTHER): Payer: 59 | Admitting: Family Medicine

## 2015-02-07 VITALS — BP 120/88 | Temp 98.3°F | Wt 346.0 lb

## 2015-02-07 DIAGNOSIS — E139 Other specified diabetes mellitus without complications: Secondary | ICD-10-CM

## 2015-02-07 NOTE — Patient Instructions (Signed)
Metformin 500 mg,,,,,,,, one half tab daily  Continue diet and exercise program  Return in September for complete physical examination  Fasting labs one week prior

## 2015-02-07 NOTE — Progress Notes (Signed)
Pre visit review using our clinic review tool, if applicable. No additional management support is needed unless otherwise documented below in the visit note. 

## 2015-02-07 NOTE — Progress Notes (Signed)
   Subjective:    Patient ID: Jared Conner, male    DOB: 08-20-1971, 44 y.o.   MRN: [id number]  HPI Jared Conner is a 44 year old male nonsmoker who comes in today for follow-up of diabetes  This winter he started a diet and exercise program he's lost 30 some pounds. His blood sugar now is in the mid 80s A1c has dropped to 4.7% on 250 mg of metformin twice a day   Review of Systems review of systems negative no hypoglycemia    Objective:   Physical Exam   well-developed well-nourished male no acute distress vital signs stable he is afebrile weight down to 346 pounds  BP 120/80       Assessment & Plan:   hypertension at goal ........ continue current medication  Diabetes markedly improved with weight loss ....... decrease metformin to a half a pill once a day follow-up in September

## 2015-05-03 ENCOUNTER — Telehealth: Payer: Self-pay | Admitting: Family Medicine

## 2015-05-03 NOTE — Telephone Encounter (Signed)
Has an appt with Kandee Keenory on 05/04/15

## 2015-05-03 NOTE — Telephone Encounter (Signed)
[facility] - Client TELEPHONE ADVICE RECORD [facility]  Patient Name: Jared Conner  DOB: 02/23/1971    Initial Comment Caller states his BP is 174/115.   Nurse Assessment  Nurse: Phylliss Bobowe, RN, Synetta FailAnita Date/Time (Eastern Time): 05/03/2015 4:33:47 PM  Confirm and document reason for call. If symptomatic, describe symptoms. ---Caller states his BP is 174/115. that was this AM and has not rechecked his BP further and has had no headache and has been having headaches in the past few weeks  Has the patient traveled out of the country within the last 30 days? ---No  Does the patient require triage? ---Yes  Related visit to physician within the last 2 weeks? ---No  Does the PT have any chronic conditions? (i.e. diabetes, asthma, etc.) ---Yes  List chronic conditions. ---HTN diabetes type II asthma     Guidelines    Guideline Title Affirmed Question Affirmed Notes  High Blood Pressure BP ? 180/110    Final Disposition User   See Physician within 24 Hours Rowe, Charity fundraiserN, Synetta FailAnita

## 2015-05-04 ENCOUNTER — Ambulatory Visit (INDEPENDENT_AMBULATORY_CARE_PROVIDER_SITE_OTHER): Payer: 59 | Admitting: Adult Health

## 2015-05-04 ENCOUNTER — Encounter: Payer: Self-pay | Admitting: Adult Health

## 2015-05-04 VITALS — BP 120/84 | Temp 98.1°F | Ht 73.0 in | Wt 354.2 lb

## 2015-05-04 DIAGNOSIS — I10 Essential (primary) hypertension: Secondary | ICD-10-CM

## 2015-05-04 MED ORDER — LISINOPRIL-HYDROCHLOROTHIAZIDE 10-12.5 MG PO TABS: 1.0000 | ORAL_TABLET | Freq: Every day | ORAL | Status: DC

## 2015-05-04 NOTE — Telephone Encounter (Signed)
Noted  

## 2015-05-04 NOTE — Progress Notes (Signed)
Pre visit review using our clinic review tool, if applicable. No additional management support is needed unless otherwise documented below in the visit note. 

## 2015-05-04 NOTE — Addendum Note (Signed)
Addended by: Nancy FetterNAFZIGER, Raynelle Fujikawa L on: 05/04/2015 09:50 AM   Modules accepted: SmartSet

## 2015-05-04 NOTE — Progress Notes (Addendum)
Subjective:    Patient ID: Jared Conner, male    DOB: 1971/04/15, 44 y.o.   MRN: [id number]  HPI  He has been checking his blood pressure at home. He checked it on his mom's blood pressure cuff and it was 154 systolic. Over the week he has been checking it on his own blood pressure cuff and his blood pressures have been high with the highest being 204/104 yesterday.  He took two of his blood pressure medications yesterday.   Denies any headache, blurred vision or dizziness. No CP or SOB  Today in the office his initial blood pressure was 182/114 on his cuff. This Clinical research associate took his blood pressure later in the exam and in both arms his blood pressure was 166/80's.     Review of Systems  Constitutional: Negative.   HENT: Negative.   Respiratory: Negative.   Cardiovascular: Negative.   Gastrointestinal: Negative.   Endocrine: Negative.   Genitourinary: Negative.   Musculoskeletal: Negative.   Skin: Negative.   Allergic/Immunologic: Negative.   Neurological: Negative.   Hematological: Negative.   Psychiatric/Behavioral: Negative.   All other systems reviewed and are negative.  Past Medical History  Diagnosis Date  . Hypertension   . Obese   . Ruptured disk 03/02/07    L5-S1 central nonsurgical treatment  . Diabetes mellitus without complication     History   Social History  . Marital Status: Single    Spouse Name: N/A  . Number of Children: N/A  . Years of Education: N/A   Occupational History  . Not on file.   Social History Main Topics  . Smoking status: Never Smoker   . Smokeless tobacco: Not on file  . Alcohol Use: No  . Drug Use: No  . Sexual Activity: Not on file   Other Topics Concern  . Not on file   Social History Narrative    No past surgical history on file.  Family History  Problem Relation Age of Onset  . Hypertension Mother   . Allergies Mother   . Diabetes Father   . Hypertension Father   . Allergies Father   . Kidney disease Father      No Known Allergies  Current Outpatient Prescriptions on File Prior to Visit  Medication Sig Dispense Refill  . ACCU-CHEK SOFTCLIX LANCETS lancets     . glucose blood (ACCU-CHEK AVIVA PLUS) test strip Use once daily for glucose control.  Dx 250.00 100 each 12  . Lancet Devices (ACCU-CHEK SOFTCLIX) lancets Use once daily for glucose control. Dx 250.00 100 each 12  . metFORMIN (GLUCOPHAGE) 500 MG tablet One half tab twice daily (Patient taking differently: One half tab once daily) 180 tablet 3   No current facility-administered medications on file prior to visit.    BP 120/84 mmHg  Temp(Src) 98.1 F (36.7 C) (Oral)  Ht  (1.854 m)  Wt 354 lb 3.2 oz (160.664 kg)  BMI 46.74 kg/m2       Objective:   Physical Exam  Constitutional: He is oriented to person, place, and time. He appears well-developed and well-nourished. No distress.  Obese   Eyes: Conjunctivae are normal. Pupils are equal, round, and reactive to light.  Neck: Normal range of motion. Neck supple.  Cardiovascular: Normal rate, regular rhythm, normal heart sounds and intact distal pulses.  Exam reveals no gallop and no friction rub.   No murmur heard. Pulmonary/Chest: Effort normal and breath sounds normal. No respiratory distress. He has no wheezes.  He has no rales. He exhibits no tenderness.  Musculoskeletal: Normal range of motion. He exhibits no edema or tenderness.  Lymphadenopathy:    He has no cervical adenopathy.  Neurological: He is alert and oriented to person, place, and time.  Skin: Skin is warm and dry. No rash noted. He is not diaphoretic. No erythema. No pallor.  Psychiatric: He has a normal mood and affect. His behavior is normal. Judgment and thought content normal.  Nursing note and vitals reviewed.         Assessment & Plan:   1. Essential hypertension - lisinopril-hydrochlorothiazide (ZESTORETIC) 10-12.5 MG per tablet; Take 1 tablet by mouth daily.  Dispense: 30 tablet; Refill: 1 -  Continue to monitor BP at home. Call with any highs or lows - Go to the ER with any SOB, CP, blurred vision, headache, dizziness or slurred speech.  - Will follow up in one week.

## 2015-05-04 NOTE — Patient Instructions (Signed)
Your blood pressure continues to be high. I have sent in a prescription for the Lisinopril-HCTZ combo pill. Please start taking this today.   Continue to monitor your blood pressure and let me know if they are still too high or too low. Follow up with me on Friday.   Go to the ER with any headache, blurred vision, dizziness etc.

## 2015-05-08 ENCOUNTER — Telehealth: Payer: Self-pay | Admitting: Family Medicine

## 2015-05-08 ENCOUNTER — Other Ambulatory Visit: Payer: Self-pay | Admitting: Adult Health

## 2015-05-08 DIAGNOSIS — I10 Essential (primary) hypertension: Secondary | ICD-10-CM

## 2015-05-08 DIAGNOSIS — E119 Type 2 diabetes mellitus without complications: Secondary | ICD-10-CM

## 2015-05-08 NOTE — Telephone Encounter (Signed)
Pls advise.  

## 2015-05-08 NOTE — Telephone Encounter (Signed)
Pt seen on Friday and states his bp has not changed any.  Bp has been 160/100-120.   Pt is getting these reading w/ his machine and not sure what he needs to be doing. Pls advise. Pt has a fup on Friday

## 2015-05-09 ENCOUNTER — Other Ambulatory Visit (INDEPENDENT_AMBULATORY_CARE_PROVIDER_SITE_OTHER): Payer: 59

## 2015-05-09 DIAGNOSIS — I10 Essential (primary) hypertension: Secondary | ICD-10-CM | POA: Diagnosis not present

## 2015-05-09 DIAGNOSIS — E119 Type 2 diabetes mellitus without complications: Secondary | ICD-10-CM

## 2015-05-09 NOTE — Progress Notes (Signed)
Spoke with pt and pt will come in for labs today if possible.  Pt has a follow up on 05/11/15

## 2015-05-09 NOTE — Telephone Encounter (Signed)
Pt returning your call

## 2015-05-09 NOTE — Progress Notes (Signed)
Left a message for pt to return call 

## 2015-05-10 ENCOUNTER — Other Ambulatory Visit: Payer: Self-pay | Admitting: Adult Health

## 2015-05-10 DIAGNOSIS — I10 Essential (primary) hypertension: Secondary | ICD-10-CM

## 2015-05-10 LAB — BASIC METABOLIC PANEL
BUN: 14 mg/dL (ref 6–23)
CO2: 30 mEq/L (ref 19–32)
Calcium: 9.4 mg/dL (ref 8.4–10.5)
Chloride: 102 mEq/L (ref 96–112)
Creatinine, Ser: 1.01 mg/dL (ref 0.40–1.50)
GFR: 102.79 mL/min (ref >60.00)
Glucose, Bld: 75 mg/dL (ref 70–99)
Potassium: 4 mEq/L (ref 3.5–5.1)
SODIUM: 137 meq/L (ref 135–145)

## 2015-05-10 LAB — HEMOGLOBIN A1C: Hgb A1c MFr Bld: 4.5 % — ABNORMAL LOW (ref 4.6–6.5)

## 2015-05-10 MED ORDER — LISINOPRIL-HYDROCHLOROTHIAZIDE 20-25 MG PO TABS: 1.0000 | ORAL_TABLET | Freq: Every day | ORAL | Status: DC

## 2015-05-11 ENCOUNTER — Ambulatory Visit: Payer: 59 | Admitting: Adult Health

## 2015-05-18 ENCOUNTER — Ambulatory Visit (INDEPENDENT_AMBULATORY_CARE_PROVIDER_SITE_OTHER): Payer: 59 | Admitting: Adult Health

## 2015-05-18 ENCOUNTER — Encounter: Payer: Self-pay | Admitting: Adult Health

## 2015-05-18 VITALS — BP 150/90 | Temp 98.3°F | Ht 73.0 in | Wt 348.0 lb

## 2015-05-18 DIAGNOSIS — I10 Essential (primary) hypertension: Secondary | ICD-10-CM

## 2015-05-18 NOTE — Progress Notes (Signed)
Pre visit review using our clinic review tool, if applicable. No additional management support is needed unless otherwise documented below in the visit note. 

## 2015-05-18 NOTE — Progress Notes (Signed)
Subjective:    Patient ID: Jared Conner, male    DOB: 1971/10/20, 44 y.o.   MRN: [id number]  HPI Mr. Eunice Blase comes to the office today for recheck on his blood pressure. We increased his  Zestoretic to 20/25 last Friday due to his blood pressures being in the 170-200 systolic. Today he brings in his logs from the week, his blood pressures are improving and are ranging from 130-170's systolic and 80-110 diastolic. His diet has not been that good this week and has been eating high sodium foods. He denies head aches, blurred vision or feeling ill.   He would like to wait one additional week before starting another medication. He would like to see if he exercises and eats a good diet if his blood pressure gets down.    Review of Systems  HENT: Negative.   Respiratory: Negative.   Cardiovascular: Negative.   Gastrointestinal: Negative.   Genitourinary: Negative.   Musculoskeletal: Negative.   Neurological: Negative.   Psychiatric/Behavioral: Negative.   All other systems reviewed and are negative.  Past Medical History  Diagnosis Date  . Hypertension   . Obese   . Ruptured disk 03/02/07    L5-S1 central nonsurgical treatment  . Diabetes mellitus without complication     History   Social History  . Marital Status: Single    Spouse Name: N/A  . Number of Children: N/A  . Years of Education: N/A   Occupational History  . Not on file.   Social History Main Topics  . Smoking status: Never Smoker   . Smokeless tobacco: Not on file  . Alcohol Use: No  . Drug Use: No  . Sexual Activity: Not on file   Other Topics Concern  . Not on file   Social History Narrative    No past surgical history on file.  Family History  Problem Relation Age of Onset  . Hypertension Mother   . Allergies Mother   . Diabetes Father   . Hypertension Father   . Allergies Father   . Kidney disease Father     No Known Allergies  Current Outpatient Prescriptions on File Prior to Visit    Medication Sig Dispense Refill  . ACCU-CHEK SOFTCLIX LANCETS lancets     . glucose blood (ACCU-CHEK AVIVA PLUS) test strip Use once daily for glucose control.  Dx 250.00 100 each 12  . Lancet Devices (ACCU-CHEK SOFTCLIX) lancets Use once daily for glucose control. Dx 250.00 100 each 12  . lisinopril-hydrochlorothiazide (PRINZIDE,ZESTORETIC) 20-25 MG per tablet Take 1 tablet by mouth daily. 30 tablet 1  . metFORMIN (GLUCOPHAGE) 500 MG tablet One half tab twice daily (Patient taking differently: One half tab once daily) 180 tablet 3   No current facility-administered medications on file prior to visit.    BP 150/90 mmHg  Temp(Src) 98.3 F (36.8 C) (Oral)  Ht  (1.854 m)  Wt 348 lb (157.852 kg)  BMI 45.92 kg/m2       Objective:   Physical Exam  Constitutional: He is oriented to person, place, and time. He appears well-developed and well-nourished. No distress.  obese  Cardiovascular: Normal rate, regular rhythm, normal heart sounds and intact distal pulses.  Exam reveals no gallop and no friction rub.   No murmur heard. Pulmonary/Chest: Effort normal and breath sounds normal. No respiratory distress. He has no wheezes. He has no rales. He exhibits no tenderness.  Musculoskeletal: Normal range of motion. He exhibits no edema.  Neurological: He  is alert and oriented to person, place, and time.  Skin: Skin is warm and dry. No rash noted. He is not diaphoretic. No erythema. No pallor.  Psychiatric: He has a normal mood and affect. His behavior is normal. Judgment and thought content normal.  Nursing note and vitals reviewed.      Assessment & Plan:  1. Essential hypertension - Continue with current medication regimen. He will follow up in one week.  - Follow up sooner if blood pressures are above 175 systolic or 120 diastolic.

## 2015-05-25 ENCOUNTER — Other Ambulatory Visit: Payer: Self-pay | Admitting: Adult Health

## 2015-05-25 ENCOUNTER — Telehealth: Payer: Self-pay | Admitting: Adult Health

## 2015-05-25 DIAGNOSIS — I10 Essential (primary) hypertension: Secondary | ICD-10-CM

## 2015-05-25 MED ORDER — AMLODIPINE BESYLATE 2.5 MG PO TABS: 2.5000 mg | ORAL_TABLET | Freq: Every day | ORAL | Status: DC

## 2015-05-25 NOTE — Telephone Encounter (Signed)
Spoke to patient on the phone about his blood pressure readings. We increased his HCTZ-lisinopril to 20/25 last week. His blood pressures are down but continue to be elevated.   6/17 149/94 6/18 135/102 6/19 151/93, 153/117 6/20 144/98 6/21 142/91 6/22 137/92 6/23 148/100 6/24 157/126 and 150/102  Will prescribe low dose of Norvasc 2.5 mg

## 2015-07-07 ENCOUNTER — Other Ambulatory Visit: Payer: Self-pay | Admitting: Adult Health

## 2015-08-08 ENCOUNTER — Other Ambulatory Visit: Payer: 59

## 2015-08-09 ENCOUNTER — Other Ambulatory Visit: Payer: Self-pay | Admitting: Family Medicine

## 2015-08-10 ENCOUNTER — Other Ambulatory Visit (INDEPENDENT_AMBULATORY_CARE_PROVIDER_SITE_OTHER): Payer: 59

## 2015-08-10 DIAGNOSIS — E139 Other specified diabetes mellitus without complications: Secondary | ICD-10-CM | POA: Diagnosis not present

## 2015-08-10 LAB — POCT URINALYSIS DIPSTICK
Bilirubin, UA: NEGATIVE
Blood, UA: NEGATIVE
Glucose, UA: NEGATIVE
Ketones, UA: NEGATIVE
LEUKOCYTES UA: NEGATIVE
Nitrite, UA: NEGATIVE
PROTEIN UA: NEGATIVE
Spec Grav, UA: 1.02
UROBILINOGEN UA: 4
pH, UA: 7

## 2015-08-10 LAB — BASIC METABOLIC PANEL
BUN: 19 mg/dL (ref 6–23)
CO2: 31 mEq/L (ref 19–32)
CREATININE: 1.1 mg/dL (ref 0.40–1.50)
Calcium: 9 mg/dL (ref 8.4–10.5)
Chloride: 100 mEq/L (ref 96–112)
GFR: 93.04 mL/min (ref >60.00)
Glucose, Bld: 117 mg/dL — ABNORMAL HIGH (ref 70–99)
Potassium: 4 mEq/L (ref 3.5–5.1)
Sodium: 139 mEq/L (ref 135–145)

## 2015-08-10 LAB — CBC WITH DIFFERENTIAL/PLATELET
Basophils Absolute: 0 10*3/uL (ref 0.0–0.1)
Basophils Relative: 0.4 % (ref 0.0–3.0)
EOS PCT: 2.5 % (ref 0.0–5.0)
Eosinophils Absolute: 0.3 10*3/uL (ref 0.0–0.7)
HCT: 41.9 % (ref 39.0–52.0)
HEMOGLOBIN: 14.1 g/dL (ref 13.0–17.0)
LYMPHS PCT: 22 % (ref 12.0–46.0)
Lymphs Abs: 2.4 10*3/uL (ref 0.7–4.0)
MCHC: 33.6 g/dL (ref 30.0–36.0)
MCV: 93.3 fl (ref 78.0–100.0)
MONO ABS: 0.8 10*3/uL (ref 0.1–1.0)
Monocytes Relative: 7.4 % (ref 3.0–12.0)
Neutro Abs: 7.3 10*3/uL (ref 1.4–7.7)
Neutrophils Relative %: 67.7 % (ref 43.0–77.0)
Platelets: 318 10*3/uL (ref 150.0–400.0)
RBC: 4.49 Mil/uL (ref 4.22–5.81)
RDW: 12.8 % (ref 11.5–15.5)
WBC: 10.7 10*3/uL — AB (ref 4.0–10.5)

## 2015-08-10 LAB — HEPATIC FUNCTION PANEL
ALT: 13 U/L (ref 0–53)
AST: 14 U/L (ref 0–37)
Albumin: 4.2 g/dL (ref 3.5–5.2)
Alkaline Phosphatase: 58 U/L (ref 39–117)
BILIRUBIN TOTAL: 0.6 mg/dL (ref 0.2–1.2)
Bilirubin, Direct: 0.1 mg/dL (ref 0.0–0.3)
Total Protein: 7.6 g/dL (ref 6.0–8.3)

## 2015-08-10 LAB — LIPID PANEL
Cholesterol: 174 mg/dL (ref 0–200)
HDL: 34.6 mg/dL — ABNORMAL LOW (ref >39.00)
LDL CALC: 113 mg/dL — AB (ref 0–99)
NonHDL: 139.85
Total CHOL/HDL Ratio: 5
Triglycerides: 135 mg/dL (ref 0.0–149.0)
VLDL: 27 mg/dL (ref 0.0–40.0)

## 2015-08-10 LAB — MICROALBUMIN / CREATININE URINE RATIO
CREATININE, U: 196.2 mg/dL
Microalb Creat Ratio: 0.4 mg/g (ref 0.0–30.0)

## 2015-08-10 LAB — TSH: TSH: 1.82 u[IU]/mL (ref 0.35–4.50)

## 2015-08-10 LAB — PSA: PSA: 0.28 ng/mL (ref 0.10–4.00)

## 2015-08-10 LAB — HEMOGLOBIN A1C: HEMOGLOBIN A1C: 5 % (ref 4.6–6.5)

## 2015-08-15 ENCOUNTER — Encounter: Payer: 59 | Admitting: Family Medicine

## 2015-08-27 ENCOUNTER — Encounter: Payer: 59 | Admitting: Family Medicine

## 2016-01-23 ENCOUNTER — Other Ambulatory Visit: Payer: Self-pay | Admitting: Adult Health

## 2016-04-14 ENCOUNTER — Encounter: Payer: Self-pay | Admitting: Family Medicine

## 2016-04-14 ENCOUNTER — Ambulatory Visit (INDEPENDENT_AMBULATORY_CARE_PROVIDER_SITE_OTHER): Payer: 59 | Admitting: Family Medicine

## 2016-04-14 VITALS — BP 102/72 | HR 92 | Temp 98.9°F | Ht 73.0 in | Wt 345.7 lb

## 2016-04-14 DIAGNOSIS — J321 Chronic frontal sinusitis: Secondary | ICD-10-CM | POA: Diagnosis not present

## 2016-04-14 DIAGNOSIS — R05 Cough: Secondary | ICD-10-CM

## 2016-04-14 DIAGNOSIS — R059 Cough, unspecified: Secondary | ICD-10-CM

## 2016-04-14 MED ORDER — AMOXICILLIN-POT CLAVULANATE 875-125 MG PO TABS: 1.0000 | ORAL_TABLET | Freq: Two times a day (BID) | ORAL | Status: DC

## 2016-04-14 MED ORDER — BENZONATATE 100 MG PO CAPS: 100.0000 mg | ORAL_CAPSULE | Freq: Three times a day (TID) | ORAL | Status: DC

## 2016-04-14 MED ORDER — HYDROCODONE-HOMATROPINE 5-1.5 MG/5ML PO SYRP: 5.0000 mL | ORAL_SOLUTION | Freq: Three times a day (TID) | ORAL | Status: DC | PRN

## 2016-04-14 NOTE — Progress Notes (Signed)
Subjective:    Patient ID: Jared Conner, male    DOB: Nov 18, 1971, 45 y.o.   MRN: [id number]  HPI  Jared Conner is a 45 year old male who presents acutely ill today with symptoms of sinus pressure/pain, congestions, "scratchy throat" , chills, sweats, fever per patient report, nonproductive cough, and fatigue for 7 days. He reports that symptoms have been progressively worsening since they started. He denies  N/V/D, smoking, recent antibiotic use or recent sick contact exposure. Treatments at home include Zyrtec, flonase, Tylenol PM, Mucinex DM, and Sudafed PE which have provided minimal benefit.   Diabetes 1.5 which is managed as Type 2 diabetes and is well controlled. He reports checking his blood sugar regularly and also is adherent to metformin therapy and HTN therapy as prescribed.       Review of Systems  Constitutional: Positive for fever and chills.  HENT: Positive for congestion, postnasal drip, rhinorrhea, sinus pressure and sore throat.   Eyes: Negative for itching.  Respiratory: Positive for cough. Negative for shortness of breath and wheezing.   Cardiovascular: Negative for chest pain, palpitations and leg swelling.  Gastrointestinal: Negative for nausea, vomiting, abdominal pain and diarrhea.  Genitourinary: Negative for dysuria.  Musculoskeletal: Negative for myalgias.  Skin: Negative for rash.  Neurological: Negative for dizziness, light-headedness and headaches.   Past Medical History  Diagnosis Date  . Hypertension   . Obese   . Ruptured disk 03/02/07    L5-S1 central nonsurgical treatment  . Diabetes mellitus without complication [facility])      Social History   Social History  . Marital Status: Single    Spouse Name: N/A  . Number of Children: N/A  . Years of Education: N/A   Occupational History  . Not on file.   Social History Main Topics  . Smoking status: Never Smoker   . Smokeless tobacco: Not on file  . Alcohol Use: No  . Drug Use: No  . Sexual  Activity: Not on file   Other Topics Concern  . Not on file   Social History Narrative    No past surgical history on file.  Family History  Problem Relation Age of Onset  . Hypertension Mother   . Allergies Mother   . Diabetes Father   . Hypertension Father   . Allergies Father   . Kidney disease Father     No Known Allergies  Current Outpatient Prescriptions on File Prior to Visit  Medication Sig Dispense Refill  . ACCU-CHEK SOFTCLIX LANCETS lancets     . amLODipine (NORVASC) 2.5 MG tablet Take 1 tablet (2.5 mg total) by mouth daily. 90 tablet 3  . glucose blood (ACCU-CHEK AVIVA PLUS) test strip Use once daily for glucose control.  Dx 250.00 100 each 12  . Lancet Devices (ACCU-CHEK SOFTCLIX) lancets Use once daily for glucose control. Dx 250.00 100 each 12  . lisinopril-hydrochlorothiazide (PRINZIDE,ZESTORETIC) 20-25 MG tablet take 1 tablet by mouth once daily 30 tablet 3  . metFORMIN (GLUCOPHAGE) 500 MG tablet take 1/2 tablet by mouth twice a day with food 180 tablet 1   No current facility-administered medications on file prior to visit.    BP 102/72 mmHg  Pulse 92  Temp(Src) 98.9 F (37.2 C)  Ht  (1.854 m)  Wt 345 lb 11.2 oz (156.808 kg)  BMI 45.62 kg/m2  SpO2 96%       Objective:   Physical Exam  Constitutional: He is oriented to person, place, and time. He  appears well-developed and well-nourished.  HENT:  Right Ear: Tympanic membrane normal.  Left Ear: Tympanic membrane normal.  Nose: Rhinorrhea present. Right sinus exhibits maxillary sinus tenderness and frontal sinus tenderness. Left sinus exhibits maxillary sinus tenderness and frontal sinus tenderness.  Mouth/Throat: Mucous membranes are normal. No oropharyngeal exudate or posterior oropharyngeal erythema.  Erythematous nasal membranes  Eyes: Pupils are equal, round, and reactive to light. No scleral icterus.  Neck: Neck supple.  Cardiovascular: Normal rate, regular rhythm and normal heart  sounds.   Pulmonary/Chest: Effort normal and breath sounds normal. He has no wheezes. He has no rales.  Abdominal: Soft.  Lymphadenopathy:    He has cervical adenopathy.  Neurological: He is alert and oriented to person, place, and time.  Skin: Skin is warm and dry. No rash noted.  Psychiatric: He has a normal mood and affect. His behavior is normal. Judgment and thought content normal.       Assessment & Plan:  1. Frontal sinusitis, unspecified chronicity Acute sinusitis: Rx with Augmentin 875mg  BID x 10 days, Benzonatate 100mg  TID as needed for cough. Patient advised to increase fluids, rest, and use a probiotic during antibiotic therapy if needed. Tylenol or ibuprofen may be used for discomfort if needed. Instructed patient to return to clinic for further evaluation if symptoms do not improve in 2-3 days with treatment, worsen, or temp >101. Patient voiced understanding and agreed with plan.  - amoxicillin-clavulanate (AUGMENTIN) 875-125 MG tablet; Take 1 tablet by mouth 2 (two) times daily.  Dispense: 20 tablet; Refill: 0  2. Cough Advised patient to avoid multiple OTC products for cough/cold. Further advised patient to continue flonase and zyrtec as needed and avoid other cough/cold products when taking prescribed the prescribed medications below.  - benzonatate (TESSALON) 100 MG capsule; Take 1 capsule (100 mg total) by mouth 3 (three) times daily.  Dispense: 20 capsule; Refill: 0 - HYDROcodone-homatropine (HYCODAN) 5-1.5 MG/5ML syrup; Take 5 mLs by mouth every 8 (eight) hours as needed for cough.  Dispense: 120 mL; Refill: 0

## 2016-04-14 NOTE — Patient Instructions (Signed)
Advised patient on supportive measures:  Get rest, drink plenty of fluids, and use tylenol or ibuprofen as needed for pain. Follow up if fever >101, if symptoms worsen or if symptoms are not improved in 3-4 days.   Sinusitis, Adult Sinusitis is redness, soreness, and inflammation of the paranasal sinuses. Paranasal sinuses are air pockets within the bones of your face. They are located beneath your eyes, in the middle of your forehead, and above your eyes. In healthy paranasal sinuses, mucus is able to drain out, and air is able to circulate through them by way of your nose. However, when your paranasal sinuses are inflamed, mucus and air can become trapped. This can allow bacteria and other germs to grow and cause infection. Sinusitis can develop quickly and last only a short time (acute) or continue over a long period (chronic). Sinusitis that lasts for more than 12 weeks is considered chronic. CAUSES Causes of sinusitis include:  Allergies.  Structural abnormalities, such as displacement of the cartilage that separates your nostrils (deviated septum), which can decrease the air flow through your nose and sinuses and affect sinus drainage.  Functional abnormalities, such as when the small hairs (cilia) that line your sinuses and help remove mucus do not work properly or are not present. SIGNS AND SYMPTOMS Symptoms of acute and chronic sinusitis are the same. The primary symptoms are pain and pressure around the affected sinuses. Other symptoms include:  Upper toothache.  Earache.  Headache.  Bad breath.  Decreased sense of smell and taste.  A cough, which worsens when you are lying flat.  Fatigue.  Fever.  Thick drainage from your nose, which often is green and may contain pus (purulent).  Swelling and warmth over the affected sinuses. DIAGNOSIS Your health care provider will perform a physical exam. During your exam, your health care provider may perform any of the following  to help determine if you have acute sinusitis or chronic sinusitis:  Look in your nose for signs of abnormal growths in your nostrils (nasal polyps).  Tap over the affected sinus to check for signs of infection.  View the inside of your sinuses using an imaging device that has a light attached (endoscope). If your health care provider suspects that you have chronic sinusitis, one or more of the following tests may be recommended:  Allergy tests.  Nasal culture. A sample of mucus is taken from your nose, sent to a lab, and screened for bacteria.  Nasal cytology. A sample of mucus is taken from your nose and examined by your health care provider to determine if your sinusitis is related to an allergy. TREATMENT Most cases of acute sinusitis are related to a viral infection and will resolve on their own within 10 days. Sometimes, medicines are prescribed to help relieve symptoms of both acute and chronic sinusitis. These may include pain medicines, decongestants, nasal steroid sprays, or saline sprays. However, for sinusitis related to a bacterial infection, your health care provider will prescribe antibiotic medicines. These are medicines that will help kill the bacteria causing the infection. Rarely, sinusitis is caused by a fungal infection. In these cases, your health care provider will prescribe antifungal medicine. For some cases of chronic sinusitis, surgery is needed. Generally, these are cases in which sinusitis recurs more than 3 times per year, despite other treatments. HOME CARE INSTRUCTIONS  Drink plenty of water. Water helps thin the mucus so your sinuses can drain more easily.  Use a humidifier.  Inhale steam 3-4 times  a day (for example, sit in the bathroom with the shower running).  Apply a warm, moist washcloth to your face 3-4 times a day, or as directed by your health care provider.  Use saline nasal sprays to help moisten and clean your sinuses.  Take medicines only  as directed by your health care provider.  If you were prescribed either an antibiotic or antifungal medicine, finish it all even if you start to feel better. SEEK IMMEDIATE MEDICAL CARE IF:  You have increasing pain or severe headaches.  You have nausea, vomiting, or drowsiness.  You have swelling around your face.  You have vision problems.  You have a stiff neck.  You have difficulty breathing.   This information is not intended to replace advice given to you by your health care provider. Make sure you discuss any questions you have with your health care provider.   Document Released: 11/17/2005 Document Revised: 12/08/2014 Document Reviewed: 12/02/2011 Elsevier Interactive Patient Education [employer]2016 [employer] Inc[employer]

## 2016-05-01 ENCOUNTER — Ambulatory Visit (INDEPENDENT_AMBULATORY_CARE_PROVIDER_SITE_OTHER): Payer: 59 | Admitting: Adult Health

## 2016-05-01 ENCOUNTER — Encounter: Payer: Self-pay | Admitting: Adult Health

## 2016-05-01 VITALS — BP 122/60 | Temp 98.4°F | Ht 73.0 in | Wt 343.0 lb

## 2016-05-01 DIAGNOSIS — J209 Acute bronchitis, unspecified: Secondary | ICD-10-CM | POA: Diagnosis not present

## 2016-05-01 MED ORDER — PREDNISONE 10 MG PO TABS: ORAL_TABLET | ORAL | Status: DC

## 2016-05-01 NOTE — Progress Notes (Signed)
Subjective:    Patient ID: Jared Conner, male    DOB: [birth year], 45 y.o.   MRN: [id number]  Cough This is a new problem. The current episode started 1 to 4 weeks ago. The problem has been unchanged. The cough is non-productive. Associated symptoms include rhinorrhea, a sore throat (resolved ), shortness of breath and sweats. Pertinent negatives include no ear congestion, ear pain, fever or headaches. Nothing aggravates the symptoms. He has tried OTC cough suppressant for the symptoms. His past medical history is significant for environmental allergies.      Review of Systems  Constitutional: Negative for fever, diaphoresis, activity change and fatigue.  HENT: Positive for rhinorrhea and sore throat (resolved ). Negative for ear pain.   Eyes: Negative.   Respiratory: Positive for cough and shortness of breath.   Cardiovascular: Negative.   Allergic/Immunologic: Positive for environmental allergies.  Neurological: Negative.  Negative for headaches.   Past Medical History  Diagnosis Date  . Hypertension   . Obese   . Ruptured disk 03/02/07    L5-S1 central nonsurgical treatment  . Diabetes mellitus without complication [facility])     Social History   Social History  . Marital Status: Single    Spouse Name: N/A  . Number of Children: N/A  . Years of Education: N/A   Occupational History  . Not on file.   Social History Main Topics  . Smoking status: Never Smoker   . Smokeless tobacco: Not on file  . Alcohol Use: No  . Drug Use: No  . Sexual Activity: Not on file   Other Topics Concern  . Not on file   Social History Narrative    No past surgical history on file.  Family History  Problem Relation Age of Onset  . Hypertension Mother   . Allergies Mother   . Diabetes Father   . Hypertension Father   . Allergies Father   . Kidney disease Father     No Known Allergies  Current Outpatient Prescriptions on File Prior to Visit  Medication Sig Dispense Refill  .  ACCU-CHEK SOFTCLIX LANCETS lancets     . amLODipine (NORVASC) 2.5 MG tablet Take 1 tablet (2.5 mg total) by mouth daily. 90 tablet 3  . glucose blood (ACCU-CHEK AVIVA PLUS) test strip Use once daily for glucose control.  Dx 250.00 100 each 12  . Lancet Devices (ACCU-CHEK SOFTCLIX) lancets Use once daily for glucose control. Dx 250.00 100 each 12  . lisinopril-hydrochlorothiazide (PRINZIDE,ZESTORETIC) 20-25 MG tablet take 1 tablet by mouth once daily 30 tablet 3  . metFORMIN (GLUCOPHAGE) 500 MG tablet take 1/2 tablet by mouth twice a day with food 180 tablet 1   No current facility-administered medications on file prior to visit.    BP 122/60 mmHg  Temp(Src) 98.4 F (36.9 C) (Oral)  Ht 6\' 1"  (1.854 m)  Wt 343 lb (155.584 kg)  BMI 45.26 kg/m2       Objective:   Physical Exam  Constitutional: He is oriented to person, place, and time. He appears well-developed and well-nourished. No distress.  HENT:  Head: Normocephalic and atraumatic.  Right Ear: External ear normal.  Left Ear: External ear normal.  Nose: Nose normal.  Mouth/Throat: Oropharynx is clear and moist. No oropharyngeal exudate.  Eyes: Conjunctivae and EOM are normal. Pupils are equal, round, and reactive to light. Right eye exhibits no discharge.  Neck: Normal range of motion. Neck supple. No thyromegaly present.  Cardiovascular: Normal rate, regular rhythm, normal  heart sounds and intact distal pulses.  Exam reveals no gallop.   No murmur heard. Pulmonary/Chest: Effort normal. No respiratory distress. He has no wheezes. He has no rales. He exhibits no tenderness.  Increased expiratory phase  Lymphadenopathy:    He has no cervical adenopathy.  Neurological: He is alert and oriented to person, place, and time.  Skin: Skin is warm and dry. No rash noted. He is not diaphoretic. No erythema. No pallor.  Psychiatric: He has a normal mood and affect. His behavior is normal. Judgment and thought content normal.  Nursing note  and vitals reviewed.     Assessment & Plan:  1. Acute bronchitis, unspecified organism - No concern for pneumonia  - predniSONE (DELTASONE) 10 MG tablet; 40 mg x 3 days, 20 mg x 3 days, 10 mg x 3 days.  Dispense: 21 tablet; Refill: 0 - Take with plenty of water - Follow up if no improvement in the next 2-3 days  Shirline Frees, NP

## 2016-05-01 NOTE — Patient Instructions (Addendum)
It was great seeing you again today  Your exam is consistent with bronchitis. I have sent in a prescription for prednisone. Take this as directed and with a lot of water to keep your sugars down.   Follow up if no improvement  Acute Bronchitis Bronchitis is inflammation of the airways that extend from the windpipe into the lungs (bronchi). The inflammation often causes mucus to develop. This leads to a cough, which is the most common symptom of bronchitis.  In acute bronchitis, the condition usually develops suddenly and goes away over time, usually in a couple weeks. Smoking, allergies, and asthma can make bronchitis worse. Repeated episodes of bronchitis may cause further lung problems.  CAUSES Acute bronchitis is most often caused by the same virus that causes a cold. The virus can spread from person to person (contagious) through coughing, sneezing, and touching contaminated objects. SIGNS AND SYMPTOMS   Cough.   Fever.   Coughing up mucus.   Body aches.   Chest congestion.   Chills.   Shortness of breath.   Sore throat.  DIAGNOSIS  Acute bronchitis is usually diagnosed through a physical exam. Your health care provider will also ask you questions about your medical history. Tests, such as chest X-rays, are sometimes done to rule out other conditions.  TREATMENT  Acute bronchitis usually goes away in a couple weeks. Oftentimes, no medical treatment is necessary. Medicines are sometimes given for relief of fever or cough. Antibiotic medicines are usually not needed but may be prescribed in certain situations. In some cases, an inhaler may be recommended to help reduce shortness of breath and control the cough. A cool mist vaporizer may also be used to help thin bronchial secretions and make it easier to clear the chest.  HOME CARE INSTRUCTIONS  Get plenty of rest.   Drink enough fluids to keep your urine clear or pale yellow (unless you have a medical condition that  requires fluid restriction). Increasing fluids may help thin your respiratory secretions (sputum) and reduce chest congestion, and it will prevent dehydration.   Take medicines only as directed by your health care provider.  If you were prescribed an antibiotic medicine, finish it all even if you start to feel better.  Avoid smoking and secondhand smoke. Exposure to cigarette smoke or irritating chemicals will make bronchitis worse. If you are a smoker, consider using nicotine gum or skin patches to help control withdrawal symptoms. Quitting smoking will help your lungs heal faster.   Reduce the chances of another bout of acute bronchitis by washing your hands frequently, avoiding people with cold symptoms, and trying not to touch your hands to your mouth, nose, or eyes.   Keep all follow-up visits as directed by your health care provider.  SEEK MEDICAL CARE IF: Your symptoms do not improve after 1 week of treatment.  SEEK IMMEDIATE MEDICAL CARE IF:  You develop an increased fever or chills.   You have chest pain.   You have severe shortness of breath.  You have bloody sputum.   You develop dehydration.  You faint or repeatedly feel like you are going to pass out.  You develop repeated vomiting.  You develop a severe headache. MAKE SURE YOU:   Understand these instructions.  Will watch your condition.  Will get help right away if you are not doing well or get worse.   This information is not intended to replace advice given to you by your health care provider. Make sure you discuss any  questions you have with your health care provider.   Document Released: 12/25/2004 Document Revised: 12/08/2014 Document Reviewed: 05/10/2013 Elsevier Interactive Patient Education [employer].

## 2016-05-29 ENCOUNTER — Other Ambulatory Visit: Payer: Self-pay | Admitting: Adult Health

## 2016-06-02 ENCOUNTER — Other Ambulatory Visit: Payer: Self-pay | Admitting: Adult Health

## 2016-08-27 ENCOUNTER — Other Ambulatory Visit: Payer: Self-pay | Admitting: Family Medicine

## 2016-10-04 ENCOUNTER — Other Ambulatory Visit: Payer: Self-pay | Admitting: Adult Health

## 2016-11-06 ENCOUNTER — Other Ambulatory Visit: Payer: Self-pay | Admitting: Adult Health

## 2016-11-06 NOTE — Telephone Encounter (Signed)
Per last refill - patient needs an appt.  He has been seen for acute issues, but has not set up to establish with new provider. Ok to refill?

## 2016-11-06 NOTE — Telephone Encounter (Signed)
Ok to refill for 30 days  

## 2016-12-09 ENCOUNTER — Other Ambulatory Visit: Payer: Self-pay | Admitting: Adult Health

## 2016-12-10 NOTE — Telephone Encounter (Signed)
Left message for pt to call office back in regarding further refills for Lisinopril.

## 2016-12-10 NOTE — Telephone Encounter (Signed)
Last refilled 11/06/16 - per your refill note, you stated no further refills until patient is seen. Ok to refill?

## 2016-12-10 NOTE — Telephone Encounter (Signed)
Dr. Todd patient  

## 2016-12-22 ENCOUNTER — Ambulatory Visit: Payer: 59 | Admitting: Family Medicine

## 2016-12-24 ENCOUNTER — Ambulatory Visit: Payer: 59 | Admitting: Family Medicine

## 2016-12-31 ENCOUNTER — Encounter: Payer: Self-pay | Admitting: Family Medicine

## 2016-12-31 ENCOUNTER — Ambulatory Visit (INDEPENDENT_AMBULATORY_CARE_PROVIDER_SITE_OTHER): Payer: 59 | Admitting: Family Medicine

## 2016-12-31 VITALS — BP 110/80 | HR 73 | Temp 98.1°F | Ht 73.0 in | Wt 354.2 lb

## 2016-12-31 DIAGNOSIS — E109 Type 1 diabetes mellitus without complications: Secondary | ICD-10-CM | POA: Diagnosis not present

## 2016-12-31 DIAGNOSIS — E139 Other specified diabetes mellitus without complications: Secondary | ICD-10-CM

## 2016-12-31 DIAGNOSIS — Z23 Encounter for immunization: Secondary | ICD-10-CM

## 2016-12-31 DIAGNOSIS — E663 Overweight: Secondary | ICD-10-CM | POA: Diagnosis not present

## 2016-12-31 DIAGNOSIS — I10 Essential (primary) hypertension: Secondary | ICD-10-CM

## 2016-12-31 LAB — HEPATIC FUNCTION PANEL
ALBUMIN: 4.3 g/dL (ref 3.5–5.2)
ALK PHOS: 47 U/L (ref 39–117)
ALT: 21 U/L (ref 0–53)
AST: 19 U/L (ref 0–37)
BILIRUBIN DIRECT: 0.1 mg/dL (ref 0.0–0.3)
TOTAL PROTEIN: 7.5 g/dL (ref 6.0–8.3)
Total Bilirubin: 0.6 mg/dL (ref 0.2–1.2)

## 2016-12-31 LAB — CBC WITH DIFFERENTIAL/PLATELET
BASOS ABS: 0.1 10*3/uL (ref 0.0–0.1)
Basophils Relative: 0.6 % (ref 0.0–3.0)
Eosinophils Absolute: 0.2 10*3/uL (ref 0.0–0.7)
Eosinophils Relative: 2.6 % (ref 0.0–5.0)
HEMATOCRIT: 41.3 % (ref 39.0–52.0)
Hemoglobin: 13.9 g/dL (ref 13.0–17.0)
LYMPHS PCT: 20.3 % (ref 12.0–46.0)
Lymphs Abs: 1.9 10*3/uL (ref 0.7–4.0)
MCHC: 33.7 g/dL (ref 30.0–36.0)
MCV: 93.4 fl (ref 78.0–100.0)
MONOS PCT: 7.2 % (ref 3.0–12.0)
Monocytes Absolute: 0.7 10*3/uL (ref 0.1–1.0)
NEUTROS ABS: 6.5 10*3/uL (ref 1.4–7.7)
Neutrophils Relative %: 69.3 % (ref 43.0–77.0)
PLATELETS: 309 10*3/uL (ref 150.0–400.0)
RBC: 4.42 Mil/uL (ref 4.22–5.81)
RDW: 13 % (ref 11.5–15.5)
WBC: 9.4 10*3/uL (ref 4.0–10.5)

## 2016-12-31 LAB — POCT URINALYSIS DIPSTICK
BILIRUBIN UA: NEGATIVE
GLUCOSE UA: NEGATIVE
Ketones, UA: NEGATIVE
Leukocytes, UA: NEGATIVE
NITRITE UA: NEGATIVE
PH UA: 7
Protein, UA: NEGATIVE
RBC UA: NEGATIVE
Spec Grav, UA: 1.02
UROBILINOGEN UA: 1

## 2016-12-31 LAB — BASIC METABOLIC PANEL
BUN: 14 mg/dL (ref 6–23)
CALCIUM: 9.3 mg/dL (ref 8.4–10.5)
CO2: 31 mEq/L (ref 19–32)
Chloride: 102 mEq/L (ref 96–112)
Creatinine, Ser: 1.04 mg/dL (ref 0.40–1.50)
GFR: 98.65 mL/min (ref >60.00)
GLUCOSE: 96 mg/dL (ref 70–99)
Potassium: 4 mEq/L (ref 3.5–5.1)
SODIUM: 138 meq/L (ref 135–145)

## 2016-12-31 LAB — MICROALBUMIN / CREATININE URINE RATIO
Creatinine,U: 159.7 mg/dL
MICROALB UR: 1.3 mg/dL (ref 0.0–1.9)
MICROALB/CREAT RATIO: 0.8 mg/g (ref 0.0–30.0)

## 2016-12-31 LAB — HEMOGLOBIN A1C: HEMOGLOBIN A1C: 5 % (ref 4.6–6.5)

## 2016-12-31 LAB — LIPID PANEL
CHOL/HDL RATIO: 5
Cholesterol: 175 mg/dL (ref 0–200)
HDL: 36 mg/dL — ABNORMAL LOW (ref >39.00)
LDL Cholesterol: 108 mg/dL — ABNORMAL HIGH (ref 0–99)
NONHDL: 138.77
Triglycerides: 154 mg/dL — ABNORMAL HIGH (ref 0.0–149.0)
VLDL: 30.8 mg/dL (ref 0.0–40.0)

## 2016-12-31 LAB — TSH: TSH: 1.74 u[IU]/mL (ref 0.35–4.50)

## 2016-12-31 LAB — PSA: PSA: 0.29 ng/mL (ref 0.10–4.00)

## 2016-12-31 MED ORDER — AMLODIPINE BESYLATE 2.5 MG PO TABS: 2.5000 mg | ORAL_TABLET | Freq: Every day | ORAL | 5 refills | Status: DC

## 2016-12-31 MED ORDER — LISINOPRIL-HYDROCHLOROTHIAZIDE 20-25 MG PO TABS: 1.0000 | ORAL_TABLET | Freq: Every day | ORAL | 5 refills | Status: DC

## 2016-12-31 MED ORDER — SIMVASTATIN 10 MG PO TABS: 10.0000 mg | ORAL_TABLET | Freq: Every day | ORAL | 5 refills | Status: DC

## 2016-12-31 MED ORDER — GLUCOSE BLOOD VI STRP: ORAL_STRIP | 12 refills | Status: DC

## 2016-12-31 MED ORDER — ACCU-CHEK SOFTCLIX LANCET DEV MISC: 12 refills | Status: DC

## 2016-12-31 MED ORDER — METFORMIN HCL 500 MG PO TABS: ORAL_TABLET | ORAL | 5 refills | Status: DC

## 2016-12-31 NOTE — Progress Notes (Signed)
Jared Conner is a 46 year old male nonsmoker who comes in today for evaluation of metabolic syndrome  His weight a year ago was 343 pounds. Prior that he was up to 380 pounds. He's now 354.  His blood pressure is 110/80 on Norvasc 2.5 mg along with Zestoretic 20-25.  He takes an aspirin tablet daily. Last LDL was 113. Recommend we reduce his LDL below 75. We'll add low-dose statin 10 mg daily at bedtime along with the aspirin tablet and follow-up lipid panel in 2-3 months.  He takes metformin 250 mg before breakfast for mild diabetes last A1c was September 2016 5.0.  He gets routine eye care by Dr., chest. Recommend he see an ophthalmologist Dr. Hazle Quantigby. He gets regular dental care. Colonoscopy not until age 46. No family history of colon cancer polyps.  Family history dad died of complications diabetes mom had hypertension one brother Health  Vaccinations tetanus 2009. He's not sure if he had a flu shot. We'll give him one today.  Social history he's single he is a Associate Professorcomputer expert by trade  14 point review of systems reviewed and otherwise negative  BP 110/80 (BP Location: Left Arm, Patient Position: Sitting, Cuff Size: Large)   Pulse 73   Temp 98.1 F (36.7 C) (Oral)   Ht 6\' 1"  (1.854 m)   Wt (!) 354 lb 3.2 oz (160.7 kg)   BMI 46.73 kg/m  Examination HEENT were negative neck was supple no adenopathy thyroid normal no carotid bruits cardiopulmonary exam normal abdominal exam normal genitalia normal circumcised male extremities normal skin normal peripheral pulses normal  #1 obesity,,,,,,, Mediterranean diet sugar-free  #2 hypertension at goal,,,,,,,, continue current therapy  #3 diabetes type 2,,,,,,, continue metformin 250 mg before breakfast,,,,,,,,, check labs  #4 hyperlipidemia,,,,,,,,,, Zocor 10 mg daily at bedtime along with a baby aspirin,,,,,,,, LDL goal below 75,,,,,,,, follow-up lipid liver panel in 2-3 months

## 2016-12-31 NOTE — Patient Instructions (Signed)
Continue current medications  Begin a walking program............ 30 minutes daily  Dietary wise........... I would recommend a Mediterranean diet........... sugar free also no high fructose corn syrup  Check your blood sugar weekly since its normal  Labs today........... I will call you if there is anything abnormal  Follow-up A1c in 6 months......Marland Kitchen.  Zocor 10 mg.......Marland Kitchen. 1 at bedtime along with an 81 mg baby aspirin  Follow-up fasting labs in 2 months......Marland Kitchen. we will call you the report..............Marland Kitchen. LDL goal below 75

## 2017-02-25 ENCOUNTER — Other Ambulatory Visit: Payer: 59

## 2017-03-03 ENCOUNTER — Other Ambulatory Visit: Payer: Self-pay | Admitting: Family Medicine

## 2017-03-04 ENCOUNTER — Other Ambulatory Visit (INDEPENDENT_AMBULATORY_CARE_PROVIDER_SITE_OTHER): Payer: 59

## 2017-03-04 DIAGNOSIS — E109 Type 1 diabetes mellitus without complications: Secondary | ICD-10-CM

## 2017-03-04 DIAGNOSIS — E139 Other specified diabetes mellitus without complications: Secondary | ICD-10-CM

## 2017-03-04 LAB — LIPID PANEL
Cholesterol: 166 mg/dL (ref 0–200)
HDL: 36.3 mg/dL — ABNORMAL LOW (ref >39.00)
LDL CALC: 103 mg/dL — AB (ref 0–99)
NONHDL: 129.62
Total CHOL/HDL Ratio: 5
Triglycerides: 134 mg/dL (ref 0.0–149.0)
VLDL: 26.8 mg/dL (ref 0.0–40.0)

## 2017-03-04 LAB — HEPATIC FUNCTION PANEL
ALBUMIN: 4.2 g/dL (ref 3.5–5.2)
ALK PHOS: 46 U/L (ref 39–117)
ALT: 20 U/L (ref 0–53)
AST: 17 U/L (ref 0–37)
Bilirubin, Direct: 0.1 mg/dL (ref 0.0–0.3)
Total Bilirubin: 0.5 mg/dL (ref 0.2–1.2)
Total Protein: 7.4 g/dL (ref 6.0–8.3)

## 2017-08-04 ENCOUNTER — Ambulatory Visit: Payer: 59 | Admitting: Family Medicine

## 2017-08-25 ENCOUNTER — Ambulatory Visit: Payer: 59 | Admitting: Family Medicine

## 2018-01-15 ENCOUNTER — Telehealth: Payer: Self-pay | Admitting: Family Medicine

## 2018-01-20 ENCOUNTER — Ambulatory Visit: Payer: 59 | Admitting: Family Medicine

## 2018-01-27 ENCOUNTER — Ambulatory Visit: Payer: 59 | Admitting: Family Medicine

## 2018-01-27 ENCOUNTER — Encounter: Payer: Self-pay | Admitting: Family Medicine

## 2018-01-27 ENCOUNTER — Other Ambulatory Visit: Payer: Self-pay | Admitting: Family Medicine

## 2018-01-27 VITALS — BP 118/76 | HR 82 | Temp 98.7°F | Wt 354.0 lb

## 2018-01-27 DIAGNOSIS — E1169 Type 2 diabetes mellitus with other specified complication: Secondary | ICD-10-CM | POA: Diagnosis not present

## 2018-01-27 DIAGNOSIS — E663 Overweight: Secondary | ICD-10-CM

## 2018-01-27 DIAGNOSIS — N401 Enlarged prostate with lower urinary tract symptoms: Secondary | ICD-10-CM | POA: Diagnosis not present

## 2018-01-27 DIAGNOSIS — E785 Hyperlipidemia, unspecified: Secondary | ICD-10-CM

## 2018-01-27 DIAGNOSIS — I10 Essential (primary) hypertension: Secondary | ICD-10-CM | POA: Diagnosis not present

## 2018-01-27 DIAGNOSIS — R351 Nocturia: Secondary | ICD-10-CM

## 2018-01-27 DIAGNOSIS — E109 Type 1 diabetes mellitus without complications: Secondary | ICD-10-CM | POA: Diagnosis not present

## 2018-01-27 DIAGNOSIS — E139 Other specified diabetes mellitus without complications: Secondary | ICD-10-CM

## 2018-01-27 LAB — BASIC METABOLIC PANEL
BUN: 15 mg/dL (ref 6–23)
CALCIUM: 9.8 mg/dL (ref 8.4–10.5)
CO2: 30 meq/L (ref 19–32)
CREATININE: 1.06 mg/dL (ref 0.40–1.50)
Chloride: 99 mEq/L (ref 96–112)
GFR: 96.06 mL/min (ref >60.00)
GLUCOSE: 112 mg/dL — AB (ref 70–99)
Potassium: 4.3 mEq/L (ref 3.5–5.1)
Sodium: 137 mEq/L (ref 135–145)

## 2018-01-27 LAB — CBC WITH DIFFERENTIAL/PLATELET
BASOS ABS: 0.1 10*3/uL (ref 0.0–0.1)
BASOS PCT: 0.8 % (ref 0.0–3.0)
EOS ABS: 0.2 10*3/uL (ref 0.0–0.7)
Eosinophils Relative: 2.1 % (ref 0.0–5.0)
HEMATOCRIT: 42.3 % (ref 39.0–52.0)
HEMOGLOBIN: 14.6 g/dL (ref 13.0–17.0)
LYMPHS PCT: 19.9 % (ref 12.0–46.0)
Lymphs Abs: 1.9 10*3/uL (ref 0.7–4.0)
MCHC: 34.5 g/dL (ref 30.0–36.0)
MCV: 92.3 fl (ref 78.0–100.0)
MONO ABS: 0.7 10*3/uL (ref 0.1–1.0)
Monocytes Relative: 7.1 % (ref 3.0–12.0)
Neutro Abs: 6.7 10*3/uL (ref 1.4–7.7)
Neutrophils Relative %: 70.1 % (ref 43.0–77.0)
PLATELETS: 315 10*3/uL (ref 150.0–400.0)
RBC: 4.58 Mil/uL (ref 4.22–5.81)
RDW: 12.9 % (ref 11.5–15.5)
WBC: 9.6 10*3/uL (ref 4.0–10.5)

## 2018-01-27 LAB — LIPID PANEL
CHOL/HDL RATIO: 4
CHOLESTEROL: 151 mg/dL (ref 0–200)
HDL: 38.1 mg/dL — AB (ref >39.00)
LDL CALC: 93 mg/dL (ref 0–99)
NonHDL: 112.45
TRIGLYCERIDES: 97 mg/dL (ref 0.0–149.0)
VLDL: 19.4 mg/dL (ref 0.0–40.0)

## 2018-01-27 LAB — PSA: PSA: 0.34 ng/mL (ref 0.10–4.00)

## 2018-01-27 LAB — MICROALBUMIN / CREATININE URINE RATIO
Creatinine,U: 171.5 mg/dL
Microalb Creat Ratio: 0.4 mg/g (ref 0.0–30.0)
Microalb, Ur: <0.7 mg/dL (ref 0.0–1.9)

## 2018-01-27 LAB — HEPATIC FUNCTION PANEL
ALBUMIN: 4.1 g/dL (ref 3.5–5.2)
ALT: 16 U/L (ref 0–53)
AST: 14 U/L (ref 0–37)
Alkaline Phosphatase: 54 U/L (ref 39–117)
BILIRUBIN DIRECT: 0.1 mg/dL (ref 0.0–0.3)
TOTAL PROTEIN: 7.7 g/dL (ref 6.0–8.3)
Total Bilirubin: 0.6 mg/dL (ref 0.2–1.2)

## 2018-01-27 LAB — HEMOGLOBIN A1C: HEMOGLOBIN A1C: 5.2 % (ref 4.6–6.5)

## 2018-01-27 LAB — TSH: TSH: 1.39 u[IU]/mL (ref 0.35–4.50)

## 2018-01-27 MED ORDER — METFORMIN HCL 500 MG PO TABS: ORAL_TABLET | ORAL | 4 refills | Status: DC

## 2018-01-27 MED ORDER — LISINOPRIL-HYDROCHLOROTHIAZIDE 20-25 MG PO TABS: 1.0000 | ORAL_TABLET | Freq: Every day | ORAL | 4 refills | Status: DC

## 2018-01-27 MED ORDER — AMLODIPINE BESYLATE 2.5 MG PO TABS: 2.5000 mg | ORAL_TABLET | Freq: Every day | ORAL | 4 refills | Status: DC

## 2018-01-27 NOTE — Patient Instructions (Signed)
Continue current treatment program  Labs today  Set up a time in the next couple weeks for a 30 minute appointment for general physical exam

## 2018-01-27 NOTE — Progress Notes (Signed)
Jared Conner is a 47 year old male nonsmoker who comes in today for follow-up of hypertension  He was here last fall and saw Kandee KeenCory because his blood pressure was not at goal. He was on Zestoretic 20-25. Norvasc 2.5 mg was added. He's been taking it now for about 3 months. BP today 130/80 right arm sitting position  Last physical exam January 2018.  He wonders if he could stop the  Norvasc.  BP 118/76 (BP Location: Left Arm, Patient Position: Sitting, Cuff Size: Large)   Pulse 82   Temp 98.7 F (37.1 C) (Oral)   Wt (!) 354 lb (160.6 kg)   BMI 46.70 kg/m  Well-developed well-nourished male no acute distress vital signs stable is afebrile  #1 hypertension at goal..........Marland Kitchen. encourage patient to continue current regime  #2 diabetes type 2........ last A1c a year ago was 5.0%........ due for annual exam  #3 hyperlipidemia.......... continue current meds return CPX

## 2018-01-29 DIAGNOSIS — N401 Enlarged prostate with lower urinary tract symptoms: Secondary | ICD-10-CM | POA: Insufficient documentation

## 2018-01-29 DIAGNOSIS — R351 Nocturia: Secondary | ICD-10-CM

## 2018-01-29 NOTE — Telephone Encounter (Signed)
Pt called to check on status of refills for: metFORMIN (GLUCOPHAGE) 500 MG tablet   lisinopril-hydrochlorothiazide (PRINZIDE,ZESTORETIC) 20-25 MG tablet   amLODipine (NORVASC) 2.5 MG tablet    simvastatin (ZOCOR) 10 MG tablet   [facility] [phone]#[id number] - [place] - [address] [phone] (Phone) [phone] (Fax)

## 2018-02-01 NOTE — Telephone Encounter (Signed)
Pt2/27/2019 and Dr Tawanna Coolerodd sent in pt refill to his pharmacy.

## 2018-02-08 ENCOUNTER — Telehealth: Payer: Self-pay | Admitting: Family Medicine

## 2018-02-08 ENCOUNTER — Encounter: Payer: 59 | Admitting: Family Medicine

## 2018-02-08 NOTE — Telephone Encounter (Signed)
Spoke with pt verified with him that his medications have already been sent to [facility] for refills. Pt voiced understanding.

## 2018-02-08 NOTE — Telephone Encounter (Signed)
Copied from CRM [phone]. Topic: Quick Communication - See Telephone Encounter >> Feb 08, 2018 10:44 AM Jolayne Hainesaylor, Brittany L wrote: CRM for notification. See Telephone encounter for:   02/08/18.   Patient has a CPE on 10/1, patient had one for today and had to resch due to an emergency & he wants to know how this is going to work as far as his medicine's until he can be seen in October. Please call back at [phone]

## 2018-02-26 DIAGNOSIS — H5213 Myopia, bilateral: Secondary | ICD-10-CM | POA: Diagnosis not present

## 2018-02-26 DIAGNOSIS — E119 Type 2 diabetes mellitus without complications: Secondary | ICD-10-CM | POA: Diagnosis not present

## 2018-02-26 DIAGNOSIS — H52223 Regular astigmatism, bilateral: Secondary | ICD-10-CM | POA: Diagnosis not present

## 2018-08-31 ENCOUNTER — Encounter: Payer: 59 | Admitting: Family Medicine

## 2018-09-06 ENCOUNTER — Encounter: Payer: 59 | Admitting: Family Medicine

## 2018-10-14 ENCOUNTER — Other Ambulatory Visit: Payer: Self-pay

## 2018-11-05 ENCOUNTER — Encounter: Payer: 59 | Admitting: Internal Medicine

## 2018-12-08 ENCOUNTER — Encounter: Payer: 59 | Admitting: Internal Medicine

## 2019-02-04 ENCOUNTER — Encounter: Payer: 59 | Admitting: Internal Medicine

## 2019-03-18 ENCOUNTER — Telehealth: Payer: Self-pay | Admitting: *Deleted

## 2019-03-18 DIAGNOSIS — I10 Essential (primary) hypertension: Secondary | ICD-10-CM

## 2019-03-18 MED ORDER — METFORMIN HCL 500 MG PO TABS: ORAL_TABLET | ORAL | 0 refills | Status: DC

## 2019-03-18 MED ORDER — LISINOPRIL-HYDROCHLOROTHIAZIDE 20-25 MG PO TABS: 1.0000 | ORAL_TABLET | Freq: Every day | ORAL | 0 refills | Status: DC

## 2019-03-18 MED ORDER — AMLODIPINE BESYLATE 2.5 MG PO TABS: 2.5000 mg | ORAL_TABLET | Freq: Every day | ORAL | 0 refills | Status: DC

## 2019-03-18 MED ORDER — SIMVASTATIN 10 MG PO TABS: 10.0000 mg | ORAL_TABLET | Freq: Every day | ORAL | 0 refills | Status: DC

## 2019-03-18 NOTE — Telephone Encounter (Signed)
Refills sent

## 2019-04-01 ENCOUNTER — Encounter: Payer: 59 | Admitting: Internal Medicine

## 2019-07-13 ENCOUNTER — Other Ambulatory Visit: Payer: Self-pay | Admitting: Internal Medicine

## 2019-07-13 DIAGNOSIS — I10 Essential (primary) hypertension: Secondary | ICD-10-CM

## 2019-09-30 ENCOUNTER — Other Ambulatory Visit: Payer: Self-pay | Admitting: Internal Medicine

## 2019-10-07 ENCOUNTER — Telehealth: Payer: Self-pay | Admitting: Internal Medicine

## 2019-10-07 DIAGNOSIS — I10 Essential (primary) hypertension: Secondary | ICD-10-CM

## 2019-10-07 MED ORDER — AMLODIPINE BESYLATE 2.5 MG PO TABS: 2.5000 mg | ORAL_TABLET | Freq: Every day | ORAL | 0 refills | Status: DC

## 2019-10-07 MED ORDER — LISINOPRIL-HYDROCHLOROTHIAZIDE 20-25 MG PO TABS: 1.0000 | ORAL_TABLET | Freq: Every day | ORAL | 0 refills | Status: DC

## 2019-10-07 NOTE — Telephone Encounter (Signed)
Refills sent

## 2019-10-07 NOTE — Telephone Encounter (Signed)
Pt has a transfer of care appt on 11/04/2019 with Dr. Jerilee Hoh.

## 2019-10-07 NOTE — Telephone Encounter (Signed)
rx refill amLODipine (NORVASC) 2.5 MG tablet  lisinopril-hydrochlorothiazide (ZESTORETIC) 20-25 MG tablet  [facility] [phone] - [place], [place] [phone] (Phone) [phone] (Fax)    Patient states he has no medication left.  Patient has TOC appt December 4th.  He would like to know if he can get some medication to last him until appt.

## 2019-11-01 ENCOUNTER — Other Ambulatory Visit: Payer: Self-pay | Admitting: Internal Medicine

## 2019-11-01 DIAGNOSIS — I10 Essential (primary) hypertension: Secondary | ICD-10-CM

## 2019-11-02 NOTE — Telephone Encounter (Signed)
Patient needs a TOC

## 2019-11-03 ENCOUNTER — Other Ambulatory Visit: Payer: Self-pay

## 2019-11-04 ENCOUNTER — Encounter: Payer: Self-pay | Admitting: Internal Medicine

## 2019-11-04 ENCOUNTER — Ambulatory Visit (INDEPENDENT_AMBULATORY_CARE_PROVIDER_SITE_OTHER): Payer: 59 | Admitting: Internal Medicine

## 2019-11-04 VITALS — BP 120/70 | HR 85 | Temp 97.4°F | Wt 341.3 lb

## 2019-11-04 DIAGNOSIS — I10 Essential (primary) hypertension: Secondary | ICD-10-CM | POA: Diagnosis not present

## 2019-11-04 DIAGNOSIS — Z1211 Encounter for screening for malignant neoplasm of colon: Secondary | ICD-10-CM | POA: Diagnosis not present

## 2019-11-04 DIAGNOSIS — Z23 Encounter for immunization: Secondary | ICD-10-CM

## 2019-11-04 DIAGNOSIS — E1169 Type 2 diabetes mellitus with other specified complication: Secondary | ICD-10-CM

## 2019-11-04 DIAGNOSIS — E119 Type 2 diabetes mellitus without complications: Secondary | ICD-10-CM | POA: Diagnosis not present

## 2019-11-04 DIAGNOSIS — E785 Hyperlipidemia, unspecified: Secondary | ICD-10-CM

## 2019-11-04 DIAGNOSIS — M7542 Impingement syndrome of left shoulder: Secondary | ICD-10-CM

## 2019-11-04 LAB — POCT GLYCOSYLATED HEMOGLOBIN (HGB A1C): Hemoglobin A1C: 4.9 % (ref 4.0–5.6)

## 2019-11-04 NOTE — Patient Instructions (Addendum)
-  Nice seeing you today!!  -Schedule follow up in 3 months for your physical. Please come in fasting that day.  -Flu and tetanus vaccines today.  -STOP taking metformin.

## 2019-11-04 NOTE — Progress Notes (Signed)
Established Patient Office Visit     This visit occurred during the SARS-CoV-2 public health emergency.  Safety protocols were in place, including screening questions prior to the visit, additional usage of staff PPE, and extensive cleaning of exam room while observing appropriate contact time as indicated for disinfecting solutions.    CC/Reason for Visit: Establish care, discuss chronic medical conditions  HPI: Jared Conner is a 48 y.o. male who is coming in today for the above mentioned reasons. Past Medical History is significant for: Well-controlled type 2 diabetes only on 250 mg of Metformin, hypertension has been well controlled, hyperlipidemia not controlled on statin and morbid obesity.  She is requesting flu and tetanus vaccinations today.  He is to see Dr. Tawanna Cooler who is now retired.  He works in Teacher, early years/pre, he is a never smoker, drinks alcohol only occasionally, no surgeries, no known drug allergies.  His family history significant for a father with diabetes and maternal aunt and a maternal uncle with diabetes as well as a maternal uncle with prostate cancer.  For the past couple months he has been complaining of left shoulder pain and numbness that radiates to the side of his biceps muscle.  No injury that he can recall.   Past Medical/Surgical History: Past Medical History:  Diagnosis Date  . Diabetes mellitus without complication (HCC)   . Hypertension   . Obese   . Ruptured disk 03/02/07   L5-S1 central nonsurgical treatment    No past surgical history on file.  Social History:  reports that he has never smoked. He has never used smokeless tobacco. He reports that he does not drink alcohol or use drugs.  Allergies: No Known Allergies  Family History:  Family History  Problem Relation Age of Onset  . Hypertension Mother   . Allergies Mother   . Diabetes Father   . Hypertension Father   . Allergies Father   . Kidney disease Father      Current  Outpatient Medications:  .  ACCU-CHEK SOFTCLIX LANCETS lancets, , Disp: , Rfl:  .  amLODipine (NORVASC) 2.5 MG tablet, Take 1 tablet (2.5 mg total) by mouth daily., Disp: 30 tablet, Rfl: 0 .  glucose blood (ACCU-CHEK AVIVA PLUS) test strip, Use once daily for glucose control.  Dx 250.00, Disp: 100 each, Rfl: 12 .  Lancet Devices (ACCU-CHEK SOFTCLIX) lancets, Use once daily for glucose control. Dx 250.00, Disp: 100 each, Rfl: 12 .  lisinopril-hydrochlorothiazide (ZESTORETIC) 20-25 MG tablet, Take 1 tablet by mouth daily., Disp: 30 tablet, Rfl: 0 .  metFORMIN (GLUCOPHAGE) 500 MG tablet, take 1/2 tablet by mouth twice a day with food, Disp: 180 tablet, Rfl: 4 .  metFORMIN (GLUCOPHAGE) 500 MG tablet, One half tab every morning, Disp: 90 tablet, Rfl: 0 .  simvastatin (ZOCOR) 10 MG tablet, Take 1 tablet (10 mg total) by mouth at bedtime., Disp: 90 tablet, Rfl: 0  Review of Systems:  Constitutional: Denies fever, chills, diaphoresis, appetite change and fatigue.  HEENT: Denies photophobia, eye pain, redness, hearing loss, ear pain, congestion, sore throat, rhinorrhea, sneezing, mouth sores, trouble swallowing, neck pain, neck stiffness and tinnitus.   Respiratory: Denies SOB, DOE, cough, chest tightness,  and wheezing.   Cardiovascular: Denies chest pain, palpitations and leg swelling.  Gastrointestinal: Denies nausea, vomiting, abdominal pain, diarrhea, constipation, blood in stool and abdominal distention.  Genitourinary: Denies dysuria, urgency, frequency, hematuria, flank pain and difficulty urinating.  Endocrine: Denies: hot or cold intolerance, sweats, changes in  hair or nails, polyuria, polydipsia. Musculoskeletal: Denies myalgias, back pain, joint swelling, arthralgias and gait problem.  Skin: Denies pallor, rash and wound.  Neurological: Denies dizziness, seizures, syncope, weakness, light-headedness and headaches.  Hematological: Denies adenopathy. Easy bruising, personal or family bleeding  history  Psychiatric/Behavioral: Denies suicidal ideation, mood changes, confusion, nervousness, sleep disturbance and agitation    Physical Exam: Vitals:   11/04/19 1000  BP: 120/70  Pulse: 85  Temp: (!) 97.4 F (36.3 C)  TempSrc: Temporal  SpO2: 95%  Weight: (!) 341 lb 4.8 oz (154.8 kg)    Body mass index is 45.03 kg/m.   Constitutional: NAD, calm, comfortable obese Eyes: PERRL, lids and conjunctivae normal ENMT: Mucous membranes are moist.  Respiratory: clear to auscultation bilaterally, no wheezing, no crackles. Normal respiratory effort. No accessory muscle use.  Cardiovascular: Regular rate and rhythm, no murmurs / rubs / gallops. No extremity edema. 2+ pedal pulses.   Abdomen: no tenderness, no masses palpated. No hepatosplenomegaly. Bowel sounds positive.  Musculoskeletal: Full range of motion of left shoulder, no pain to palpation. Skin: no rashes, lesions, ulcers. No induration Neurologic: Grossly intact and nonfocal Psychiatric: Normal judgment and insight. Alert and oriented x 3. Normal mood.    Impression and Plan:  Essential hypertension -Well-controlled on current regimen of lisinopril/HCTZ and 2.5 mg of amlodipine.  Hyperlipidemia associated with type 2 diabetes mellitus ([place]) -Last LDL was 93 in February 2019, he is on simvastatin 10 mg. -Goal LDL is less than 70. -Repeat lipids at next physical, may need increase statin dose.  Type 2 diabetes mellitus without complication, without long-term current use of insulin (HCC) -A1c today is 4.9, he has only been taking 250 mg daily of Metformin. -He will discontinue Metformin and follow-up in 3 months for repeat A1c. -He states his CBGs at home on average are between 100 and 110.  Screening for colon cancer  - Plan: Ambulatory referral to Gastroenterology  Morbid obesity ([place]) -Discussed healthy lifestyle, including increased physical activity and better food choices to promote weight loss.  Impingement  syndrome of left shoulder -Have advised icing, as needed ibuprofen, have given exercises that he can try at home. -He knows to contact us if no improvement, if so can consider PT and referral to sports medicine.    Patient Instructions  -Nice seeing you today!!  -Schedule follow up in 3 months for your physical. Please come in fasting that day.  -Flu and tetanus vaccines today.  -STOP taking metformin.     Lelon Frohlich, MD [facility]

## 2019-11-21 ENCOUNTER — Other Ambulatory Visit: Payer: Self-pay

## 2019-11-21 DIAGNOSIS — I10 Essential (primary) hypertension: Secondary | ICD-10-CM

## 2019-11-21 MED ORDER — LISINOPRIL-HYDROCHLOROTHIAZIDE 20-25 MG PO TABS: 1.0000 | ORAL_TABLET | Freq: Every day | ORAL | 1 refills | Status: DC

## 2019-11-22 ENCOUNTER — Other Ambulatory Visit: Payer: Self-pay | Admitting: Internal Medicine

## 2019-11-22 DIAGNOSIS — I10 Essential (primary) hypertension: Secondary | ICD-10-CM

## 2019-11-22 MED ORDER — AMLODIPINE BESYLATE 2.5 MG PO TABS: 2.5000 mg | ORAL_TABLET | Freq: Every day | ORAL | 0 refills | Status: DC

## 2019-11-22 MED ORDER — SIMVASTATIN 10 MG PO TABS: 10.0000 mg | ORAL_TABLET | Freq: Every day | ORAL | 0 refills | Status: DC

## 2019-11-22 NOTE — Telephone Encounter (Signed)
Medication Refill - Medication: amLODipine (NORVASC) 2.5 MG tablet /simvastatin (ZOCOR) 10 MG tablet   Pt is now out of amlodipine. Contacted his pharmacy 2 weeks ago and then again on 11/19/19.  Has the patient contacted their pharmacy? Yes.   (Agent: If no, request that the patient contact the pharmacy for the refill.) (Agent: If yes, when and what did the pharmacy advise?)  Preferred Pharmacy (with phone number or street name):  [facility] [phone] - [place], [place] - [place] Phone:  [phone]  Fax:  [phone]       Agent: Please be advised that RX refills may take up to 3 business days. We ask that you follow-up with your pharmacy.

## 2019-11-23 ENCOUNTER — Other Ambulatory Visit: Payer: Self-pay | Admitting: *Deleted

## 2019-11-23 DIAGNOSIS — I10 Essential (primary) hypertension: Secondary | ICD-10-CM

## 2019-11-23 MED ORDER — AMLODIPINE BESYLATE 2.5 MG PO TABS: 2.5000 mg | ORAL_TABLET | Freq: Every day | ORAL | 0 refills | Status: DC

## 2019-11-23 MED ORDER — SIMVASTATIN 10 MG PO TABS: 10.0000 mg | ORAL_TABLET | Freq: Every day | ORAL | 0 refills | Status: DC

## 2019-12-05 ENCOUNTER — Encounter: Payer: Self-pay | Admitting: Internal Medicine

## 2020-02-03 ENCOUNTER — Encounter: Payer: 59 | Admitting: Internal Medicine

## 2020-04-13 ENCOUNTER — Encounter: Payer: 59 | Admitting: Internal Medicine

## 2020-04-14 ENCOUNTER — Other Ambulatory Visit: Payer: Self-pay | Admitting: Internal Medicine

## 2020-04-14 DIAGNOSIS — I10 Essential (primary) hypertension: Secondary | ICD-10-CM

## 2020-06-13 ENCOUNTER — Other Ambulatory Visit: Payer: Self-pay | Admitting: Internal Medicine

## 2020-06-13 DIAGNOSIS — I10 Essential (primary) hypertension: Secondary | ICD-10-CM

## 2020-07-10 ENCOUNTER — Encounter: Payer: 59 | Admitting: Internal Medicine

## 2020-07-11 ENCOUNTER — Other Ambulatory Visit: Payer: Self-pay | Admitting: Internal Medicine

## 2020-07-11 DIAGNOSIS — I10 Essential (primary) hypertension: Secondary | ICD-10-CM

## 2020-08-16 ENCOUNTER — Other Ambulatory Visit: Payer: Self-pay | Admitting: Internal Medicine

## 2020-08-16 DIAGNOSIS — I10 Essential (primary) hypertension: Secondary | ICD-10-CM

## 2020-08-30 ENCOUNTER — Other Ambulatory Visit: Payer: Self-pay | Admitting: Internal Medicine

## 2020-08-31 MED ORDER — SIMVASTATIN 10 MG PO TABS: 10.0000 mg | ORAL_TABLET | Freq: Every day | ORAL | 0 refills | Status: DC

## 2020-09-11 ENCOUNTER — Other Ambulatory Visit: Payer: Self-pay | Admitting: Internal Medicine

## 2020-09-17 ENCOUNTER — Other Ambulatory Visit: Payer: Self-pay | Admitting: Internal Medicine

## 2020-09-17 DIAGNOSIS — I10 Essential (primary) hypertension: Secondary | ICD-10-CM

## 2020-10-12 ENCOUNTER — Encounter: Payer: 59 | Admitting: Internal Medicine

## 2020-10-19 ENCOUNTER — Other Ambulatory Visit: Payer: Self-pay | Admitting: Internal Medicine

## 2020-10-19 DIAGNOSIS — I10 Essential (primary) hypertension: Secondary | ICD-10-CM

## 2020-10-25 ENCOUNTER — Other Ambulatory Visit: Payer: Self-pay | Admitting: Internal Medicine

## 2020-11-11 ENCOUNTER — Other Ambulatory Visit: Payer: Self-pay | Admitting: Internal Medicine

## 2020-11-16 ENCOUNTER — Other Ambulatory Visit: Payer: Self-pay | Admitting: Internal Medicine

## 2020-11-16 DIAGNOSIS — I10 Essential (primary) hypertension: Secondary | ICD-10-CM

## 2021-01-17 ENCOUNTER — Encounter: Payer: Self-pay | Admitting: *Deleted

## 2021-01-18 ENCOUNTER — Encounter: Payer: 59 | Admitting: Internal Medicine

## 2021-01-25 ENCOUNTER — Other Ambulatory Visit: Payer: Self-pay | Admitting: Internal Medicine

## 2021-01-25 DIAGNOSIS — I10 Essential (primary) hypertension: Secondary | ICD-10-CM

## 2021-01-25 MED ORDER — AMLODIPINE BESYLATE 2.5 MG PO TABS: 2.5000 mg | ORAL_TABLET | Freq: Every day | ORAL | 0 refills | Status: DC

## 2021-01-25 MED ORDER — LISINOPRIL-HYDROCHLOROTHIAZIDE 20-25 MG PO TABS: 1.0000 | ORAL_TABLET | Freq: Every day | ORAL | 0 refills | Status: DC

## 2021-01-25 MED ORDER — SIMVASTATIN 10 MG PO TABS: 10.0000 mg | ORAL_TABLET | Freq: Every day | ORAL | 1 refills | Status: DC

## 2021-04-05 ENCOUNTER — Encounter: Payer: 59 | Admitting: Internal Medicine

## 2021-04-10 ENCOUNTER — Other Ambulatory Visit: Payer: Self-pay

## 2021-04-11 ENCOUNTER — Ambulatory Visit: Payer: 59 | Admitting: Adult Health

## 2021-04-11 ENCOUNTER — Encounter: Payer: Self-pay | Admitting: Adult Health

## 2021-04-11 VITALS — BP 140/86 | HR 78 | Temp 98.0°F | Ht 73.0 in | Wt 312.8 lb

## 2021-04-11 DIAGNOSIS — E119 Type 2 diabetes mellitus without complications: Secondary | ICD-10-CM

## 2021-04-11 LAB — POCT GLUCOSE (DEVICE FOR HOME USE): POC Glucose: 122 mg/dl — AB (ref 70–99)

## 2021-04-11 LAB — POCT GLYCOSYLATED HEMOGLOBIN (HGB A1C): Hemoglobin A1C: 6 % — AB (ref 4.0–5.6)

## 2021-04-11 MED ORDER — BLOOD GLUCOSE MONITOR KIT: PACK | 0 refills | Status: AC

## 2021-04-11 NOTE — Progress Notes (Signed)
Subjective:    Patient ID: Jared Conner, male    DOB: Dec 07, 1970, 50 y.o.   MRN: [id number]  HPI 50 year old male who  has a past medical history of Diabetes mellitus without complication ([place]), Hypertension, Obese, and Ruptured disk (03/02/07).  He presents to the office today for concern of hypoglycemia.  He reports that over the last few weeks he has been getting some abnormal readings on his glucometer with readings between 39 and 67 periodically.  He has been asymptomatic.  His diabetes is controlled mostly with lifestyle but does have a prescription for metformin that he takes 250 mg as needed.  He does take this infrequently.    His glucometer is a few years old as are his testing strips.   Review of Systems See HPI   Past Medical History:  Diagnosis Date  . Diabetes mellitus without complication ([place])   . Hypertension   . Obese   . Ruptured disk 03/02/07   L5-S1 central nonsurgical treatment    Social History   Socioeconomic History  . Marital status: Single    Spouse name: Not on file  . Number of children: Not on file  . Years of education: Not on file  . Highest education level: Not on file  Occupational History  . Not on file  Tobacco Use  . Smoking status: Never Smoker  . Smokeless tobacco: Never Used  Substance and Sexual Activity  . Alcohol use: No  . Drug use: No  . Sexual activity: Not on file  Other Topics Concern  . Not on file  Social History Narrative  . Not on file   Social Determinants of Health   Financial Resource Strain: Not on file  Food Insecurity: Not on file  Transportation Needs: Not on file  Physical Activity: Not on file  Stress: Not on file  Social Connections: Not on file  Intimate Partner Violence: Not on file    History reviewed. No pertinent surgical history.  Family History  Problem Relation Age of Onset  . Hypertension Mother   . Allergies Mother   . Diabetes Father   . Hypertension Father   . Allergies Father    . Kidney disease Father     No Known Allergies  Current Outpatient Medications on File Prior to Visit  Medication Sig Dispense Refill  . ACCU-CHEK SOFTCLIX LANCETS lancets     . amLODipine (NORVASC) 2.5 MG tablet Take 1 tablet (2.5 mg total) by mouth daily. 90 tablet 0  . lisinopril-hydrochlorothiazide (ZESTORETIC) 20-25 MG tablet Take 1 tablet by mouth daily. 90 tablet 0  . metFORMIN (GLUCOPHAGE) 500 MG tablet take 1/2 tablet by mouth twice a day with food 180 tablet 4  . metFORMIN (GLUCOPHAGE) 500 MG tablet One half tab every morning 90 tablet 0  . simvastatin (ZOCOR) 10 MG tablet Take 1 tablet (10 mg total) by mouth daily at 6 PM. 90 tablet 1   No current facility-administered medications on file prior to visit.    BP 140/86 (BP Location: Left Arm, Patient Position: Sitting, Cuff Size: Large)   Pulse 78   Temp 98 F (36.7 C) (Oral)   Ht '6\' 1"'  (1.854 m)   Wt (!) 312 lb 12.8 oz (141.9 kg)   SpO2 95%   BMI 41.27 kg/m       Objective:   Physical Exam Vitals and nursing note reviewed.  Constitutional:      Appearance: Normal appearance.  Cardiovascular:  Rate and Rhythm: Normal rate and regular rhythm.     Pulses: Normal pulses.     Heart sounds: Normal heart sounds.  Pulmonary:     Effort: Pulmonary effort is normal.     Breath sounds: Normal breath sounds.  Skin:    General: Skin is warm and dry.     Capillary Refill: Capillary refill takes less than 2 seconds.  Neurological:     General: No focal deficit present.     Mental Status: He is alert and oriented to person, place, and time.  Psychiatric:        Mood and Affect: Mood normal.        Behavior: Behavior normal.        Thought Content: Thought content normal.        Judgment: Judgment normal.       Assessment & Plan:  1. Type 2 diabetes mellitus without complication, without long-term current use of insulin (HCC)  - POCT Glucose (Device for Home Use)- 166 - POCT glycosylated hemoglobin (Hb A1C)-  6.0 -Do not think he is actually having hypoglycemic events and likely due to old testing supplies as well as possible glucometer.  We will send in prescription for new testing supplies and glucometer.  He was advised to continue to monitor his glucose and follow-up if he is getting those readings with his new machine. - blood glucose meter kit and supplies KIT; Dispense based on patient and insurance preference. Use up to three times a day  Dispense: 1 each; Refill: 0   Dorothyann Peng, NP

## 2021-07-05 ENCOUNTER — Ambulatory Visit (INDEPENDENT_AMBULATORY_CARE_PROVIDER_SITE_OTHER): Payer: 59 | Admitting: Internal Medicine

## 2021-07-05 ENCOUNTER — Other Ambulatory Visit: Payer: Self-pay | Admitting: Internal Medicine

## 2021-07-05 ENCOUNTER — Encounter: Payer: Self-pay | Admitting: Internal Medicine

## 2021-07-05 ENCOUNTER — Other Ambulatory Visit: Payer: Self-pay

## 2021-07-05 VITALS — BP 110/80 | HR 81 | Temp 98.4°F | Ht 73.0 in | Wt 374.3 lb

## 2021-07-05 DIAGNOSIS — I10 Essential (primary) hypertension: Secondary | ICD-10-CM

## 2021-07-05 DIAGNOSIS — Z1211 Encounter for screening for malignant neoplasm of colon: Secondary | ICD-10-CM | POA: Diagnosis not present

## 2021-07-05 DIAGNOSIS — Z23 Encounter for immunization: Secondary | ICD-10-CM

## 2021-07-05 DIAGNOSIS — Z0000 Encounter for general adult medical examination without abnormal findings: Secondary | ICD-10-CM | POA: Diagnosis not present

## 2021-07-05 DIAGNOSIS — E781 Pure hyperglyceridemia: Secondary | ICD-10-CM | POA: Insufficient documentation

## 2021-07-05 DIAGNOSIS — R7302 Impaired glucose tolerance (oral): Secondary | ICD-10-CM | POA: Insufficient documentation

## 2021-07-05 DIAGNOSIS — E559 Vitamin D deficiency, unspecified: Secondary | ICD-10-CM | POA: Insufficient documentation

## 2021-07-05 DIAGNOSIS — E785 Hyperlipidemia, unspecified: Secondary | ICD-10-CM

## 2021-07-05 DIAGNOSIS — E1169 Type 2 diabetes mellitus with other specified complication: Secondary | ICD-10-CM

## 2021-07-05 LAB — CBC WITH DIFFERENTIAL/PLATELET
Basophils Absolute: 0.1 10*3/uL (ref 0.0–0.1)
Basophils Relative: 0.9 % (ref 0.0–3.0)
Eosinophils Absolute: 0.3 10*3/uL (ref 0.0–0.7)
Eosinophils Relative: 2.5 % (ref 0.0–5.0)
HCT: 39.6 % (ref 39.0–52.0)
Hemoglobin: 13.4 g/dL (ref 13.0–17.0)
Lymphocytes Relative: 23 % (ref 12.0–46.0)
Lymphs Abs: 2.3 10*3/uL (ref 0.7–4.0)
MCHC: 33.9 g/dL (ref 30.0–36.0)
MCV: 93.1 fl (ref 78.0–100.0)
Monocytes Absolute: 0.8 10*3/uL (ref 0.1–1.0)
Monocytes Relative: 7.8 % (ref 3.0–12.0)
Neutro Abs: 6.6 10*3/uL (ref 1.4–7.7)
Neutrophils Relative %: 65.8 % (ref 43.0–77.0)
Platelets: 293 10*3/uL (ref 150.0–400.0)
RBC: 4.25 Mil/uL (ref 4.22–5.81)
RDW: 13.1 % (ref 11.5–15.5)
WBC: 10.1 10*3/uL (ref 4.0–10.5)

## 2021-07-05 LAB — VITAMIN B12: Vitamin B-12: 642 pg/mL (ref 211–911)

## 2021-07-05 LAB — LIPID PANEL
Cholesterol: 141 mg/dL (ref 0–200)
HDL: 32.8 mg/dL — ABNORMAL LOW (ref >39.00)
LDL Cholesterol: 71 mg/dL (ref 0–99)
NonHDL: 108.43
Total CHOL/HDL Ratio: 4
Triglycerides: 187 mg/dL — ABNORMAL HIGH (ref 0.0–149.0)
VLDL: 37.4 mg/dL (ref 0.0–40.0)

## 2021-07-05 LAB — COMPREHENSIVE METABOLIC PANEL
ALT: 12 U/L (ref 0–53)
AST: 12 U/L (ref 0–37)
Albumin: 4.1 g/dL (ref 3.5–5.2)
Alkaline Phosphatase: 51 U/L (ref 39–117)
BUN: 13 mg/dL (ref 6–23)
CO2: 29 mEq/L (ref 19–32)
Calcium: 9.4 mg/dL (ref 8.4–10.5)
Chloride: 100 mEq/L (ref 96–112)
Creatinine, Ser: 1.15 mg/dL (ref 0.40–1.50)
GFR: 73.94 mL/min (ref >60.00)
Glucose, Bld: 125 mg/dL — ABNORMAL HIGH (ref 70–99)
Potassium: 3.9 mEq/L (ref 3.5–5.1)
Sodium: 138 mEq/L (ref 135–145)
Total Bilirubin: 0.6 mg/dL (ref 0.2–1.2)
Total Protein: 7.7 g/dL (ref 6.0–8.3)

## 2021-07-05 LAB — TSH: TSH: 2.08 u[IU]/mL (ref 0.35–5.50)

## 2021-07-05 LAB — VITAMIN D 25 HYDROXY (VIT D DEFICIENCY, FRACTURES): VITD: 25.89 ng/mL — ABNORMAL LOW (ref 30.00–100.00)

## 2021-07-05 LAB — HEMOGLOBIN A1C: Hgb A1c MFr Bld: 6.1 % (ref 4.6–6.5)

## 2021-07-05 MED ORDER — VITAMIN D (ERGOCALCIFEROL) 1.25 MG (50000 UNIT) PO CAPS: 50000.0000 [IU] | ORAL_CAPSULE | ORAL | 0 refills | Status: DC

## 2021-07-05 NOTE — Addendum Note (Signed)
Addended by: Kern Reap B on: 07/05/2021 11:31 AM   Modules accepted: Orders

## 2021-07-05 NOTE — Patient Instructions (Signed)
-  Nice seeing you today!! ? ?-Lab work today; will notify you once results are available. ? ?-First shingles vaccine today. ? ?-Schedule follow up in 3 months. ? ? ?

## 2021-07-05 NOTE — Progress Notes (Signed)
Established Patient Office Visit     This visit occurred during the SARS-CoV-2 public health emergency.  Safety protocols were in place, including screening questions prior to the visit, additional usage of staff PPE, and extensive cleaning of exam room while observing appropriate contact time as indicated for disinfecting solutions.    CC/Reason for Visit: Annual preventive exam  HPI: Jared Conner is a 50 y.o. male who is coming in today for the above mentioned reasons. Past Medical History is significant for: Morbid obesity, hyperlipidemia, hypertension and type 2 diabetes that have been well controlled in the past.  I have not seen him in almost 2 years.  He has gained 40 to 50 pounds in that timeframe.  He just joined a gym and is working towards a healthier lifestyle.  I have offered referral to weight and wellness but he declines today.  He has routine eye and dental care.  He has had all for shingles vaccine and his Tdap.  He is requesting his first shingles vaccine today.  He is overdue for initial screening colonoscopy.   Past Medical/Surgical History: Past Medical History:  Diagnosis Date   Diabetes mellitus without complication ([place])    Hypertension    Obese    Ruptured disk 03/02/07   L5-S1 central nonsurgical treatment    No past surgical history on file.  Social History:  reports that he has never smoked. He has never used smokeless tobacco. He reports that he does not drink alcohol and does not use drugs.  Allergies: No Known Allergies  Family History:  Family History  Problem Relation Age of Onset   Hypertension Mother    Allergies Mother    Diabetes Father    Hypertension Father    Allergies Father    Kidney disease Father      Current Outpatient Medications:    ACCU-CHEK SOFTCLIX LANCETS lancets, , Disp: , Rfl:    amLODipine (NORVASC) 2.5 MG tablet, Take 1 tablet (2.5 mg total) by mouth daily., Disp: 90 tablet, Rfl: 0   blood glucose meter kit  and supplies KIT, Dispense based on patient and insurance preference. Use up to three times a day, Disp: 1 each, Rfl: 0   lisinopril-hydrochlorothiazide (ZESTORETIC) 20-25 MG tablet, Take 1 tablet by mouth daily., Disp: 90 tablet, Rfl: 0   metFORMIN (GLUCOPHAGE) 500 MG tablet, take 1/2 tablet by mouth twice a day with food, Disp: 180 tablet, Rfl: 4   metFORMIN (GLUCOPHAGE) 500 MG tablet, One half tab every morning, Disp: 90 tablet, Rfl: 0   simvastatin (ZOCOR) 10 MG tablet, Take 1 tablet (10 mg total) by mouth daily at 6 PM., Disp: 90 tablet, Rfl: 1  Review of Systems:  Constitutional: Denies fever, chills, diaphoresis, appetite change and fatigue.  HEENT: Denies photophobia, eye pain, redness, hearing loss, ear pain, congestion, sore throat, rhinorrhea, sneezing, mouth sores, trouble swallowing, neck pain, neck stiffness and tinnitus.   Respiratory: Denies SOB, DOE, cough, chest tightness,  and wheezing.   Cardiovascular: Denies chest pain, palpitations and leg swelling.  Gastrointestinal: Denies nausea, vomiting, abdominal pain, diarrhea, constipation, blood in stool and abdominal distention.  Genitourinary: Denies dysuria, urgency, frequency, hematuria, flank pain and difficulty urinating.  Endocrine: Denies: hot or cold intolerance, sweats, changes in hair or nails, polyuria, polydipsia. Musculoskeletal: Denies myalgias, back pain, joint swelling, arthralgias and gait problem.  Skin: Denies pallor, rash and wound.  Neurological: Denies dizziness, seizures, syncope, weakness, light-headedness, numbness and headaches.  Hematological: Denies adenopathy. Easy  bruising, personal or family bleeding history  Psychiatric/Behavioral: Denies suicidal ideation, mood changes, confusion, nervousness, sleep disturbance and agitation    Physical Exam: Vitals:   07/05/21 0806  BP: 110/80  Pulse: 81  Temp: 98.4 F (36.9 C)  TempSrc: Oral  SpO2: 96%  Weight: (!) 374 lb 4.8 oz (169.8 kg)  Height: 6'  1" (1.854 m)    Body mass index is 49.38 kg/m.   Constitutional: NAD, calm, comfortable, obese Eyes: PERRL, lids and conjunctivae normal ENMT: Mucous membranes are moist. Posterior pharynx clear of any exudate or lesions. Normal dentition. Tympanic membrane is pearly white, no erythema or bulging. Neck: normal, supple, no masses, no thyromegaly Respiratory: clear to auscultation bilaterally, no wheezing, no crackles. Normal respiratory effort. No accessory muscle use.  Cardiovascular: Regular rate and rhythm, no murmurs / rubs / gallops. No extremity edema. 2+ pedal pulses. No carotid bruits.  Abdomen: no tenderness, no masses palpated. No hepatosplenomegaly. Bowel sounds positive.  Musculoskeletal: no clubbing / cyanosis. No joint deformity upper and lower extremities. Good ROM, no contractures. Normal muscle tone.  Skin: no rashes, lesions, ulcers. No induration Neurologic: CN 2-12 grossly intact. Sensation intact, DTR normal. Strength 5/5 in all 4.  Psychiatric: Normal judgment and insight. Alert and oriented x 3. Normal mood.    Impression and Plan:  Encounter for preventive health examination -Advised routine eye and dental care. -For shingles vaccine today, otherwise immunizations are up-to-date and age-appropriate. -Screening labs today. -Healthy lifestyle discussed in detail. -Refer to GI for initial screening colonoscopy.  Screening for malignant neoplasm of colon  - Plan: Ambulatory referral to Gastroenterology  Morbid obesity ([place]) -Discussed healthy lifestyle, including increased physical activity and better food choices to promote weight loss.  Essential hypertension -Well-controlled.  Hyperlipidemia associated with type 2 diabetes mellitus ([place])  - Plan: Lipid panel, Lipid panel -He is on simvastatin 10 mg daily.  Type 2 diabetes mellitus with other specified complication, without long-term current use of insulin (H[facility])  -Last A1c was 6, recheck labs today.  He  is on metformin only.  Need for shingles vaccine -For shingles vaccine administered today.    Patient Instructions  -Nice seeing you today!!  -Lab work today; will notify you once results are available.  -First shingles vaccine today.  -Schedule follow up in 3 months.     Lelon Frohlich, MD [facility]

## 2021-07-06 ENCOUNTER — Other Ambulatory Visit: Payer: Self-pay | Admitting: Internal Medicine

## 2021-07-06 DIAGNOSIS — I10 Essential (primary) hypertension: Secondary | ICD-10-CM

## 2021-07-09 ENCOUNTER — Other Ambulatory Visit: Payer: Self-pay | Admitting: Internal Medicine

## 2021-07-09 DIAGNOSIS — E785 Hyperlipidemia, unspecified: Secondary | ICD-10-CM

## 2021-07-09 DIAGNOSIS — E1169 Type 2 diabetes mellitus with other specified complication: Secondary | ICD-10-CM

## 2021-07-09 DIAGNOSIS — E559 Vitamin D deficiency, unspecified: Secondary | ICD-10-CM

## 2021-11-06 ENCOUNTER — Other Ambulatory Visit: Payer: Self-pay | Admitting: Internal Medicine

## 2021-11-06 DIAGNOSIS — E559 Vitamin D deficiency, unspecified: Secondary | ICD-10-CM

## 2021-11-06 DIAGNOSIS — I10 Essential (primary) hypertension: Secondary | ICD-10-CM

## 2022-02-04 ENCOUNTER — Other Ambulatory Visit: Payer: Self-pay | Admitting: Internal Medicine

## 2022-03-01 LAB — HM DIABETES EYE EXAM

## 2022-03-18 ENCOUNTER — Other Ambulatory Visit: Payer: Self-pay | Admitting: Internal Medicine

## 2022-03-18 DIAGNOSIS — E559 Vitamin D deficiency, unspecified: Secondary | ICD-10-CM

## 2022-04-15 ENCOUNTER — Other Ambulatory Visit: Payer: Self-pay | Admitting: Internal Medicine

## 2022-04-15 DIAGNOSIS — I10 Essential (primary) hypertension: Secondary | ICD-10-CM

## 2022-06-26 ENCOUNTER — Other Ambulatory Visit: Payer: Self-pay | Admitting: Internal Medicine

## 2022-06-27 ENCOUNTER — Telehealth: Payer: Self-pay | Admitting: Internal Medicine

## 2022-06-27 MED ORDER — METFORMIN HCL 500 MG PO TABS: ORAL_TABLET | ORAL | 0 refills | Status: DC

## 2022-06-27 NOTE — Telephone Encounter (Addendum)
Pt called Pharmacy and was told  metFORMIN (GLUCOPHAGE) 500 MG tablet  was denied.  Pt is requesting a refill &/or reason for denial.    Pt only has enough to last until Sunday.   Please advise.  [phone]  [id number] [place], [place] - [address] AT [facility] OF [address] Phone:  [phone]  Fax:  [phone]

## 2022-06-27 NOTE — Telephone Encounter (Signed)
Last OV 07/05/21 recommendation Return in about 3 months (around 10/05/2021) for blood pressure, dm. OV notes:  Type 2 diabetes mellitus with other specified complication, without long-term current use of insulin (HCC)  -Last A1c was 6, recheck labs today.  He is on metformin only.  Pt has not had appt since then.  Pt notified of above & that appt is needed. Pt agrees to schedule CPE. Appt scheduled & pt notified that (per refill protocol) he will receive a month supply. Pt verb understanding.

## 2022-07-29 ENCOUNTER — Ambulatory Visit (INDEPENDENT_AMBULATORY_CARE_PROVIDER_SITE_OTHER): Payer: 59 | Admitting: Internal Medicine

## 2022-07-29 VITALS — BP 110/80 | HR 82 | Temp 98.0°F | Ht 74.0 in | Wt 365.6 lb

## 2022-07-29 DIAGNOSIS — E781 Pure hyperglyceridemia: Secondary | ICD-10-CM

## 2022-07-29 DIAGNOSIS — E785 Hyperlipidemia, unspecified: Secondary | ICD-10-CM

## 2022-07-29 DIAGNOSIS — R300 Dysuria: Secondary | ICD-10-CM | POA: Diagnosis not present

## 2022-07-29 DIAGNOSIS — Z0000 Encounter for general adult medical examination without abnormal findings: Secondary | ICD-10-CM

## 2022-07-29 DIAGNOSIS — E1169 Type 2 diabetes mellitus with other specified complication: Secondary | ICD-10-CM | POA: Diagnosis not present

## 2022-07-29 DIAGNOSIS — E559 Vitamin D deficiency, unspecified: Secondary | ICD-10-CM

## 2022-07-29 DIAGNOSIS — Z125 Encounter for screening for malignant neoplasm of prostate: Secondary | ICD-10-CM

## 2022-07-29 DIAGNOSIS — Z23 Encounter for immunization: Secondary | ICD-10-CM | POA: Diagnosis not present

## 2022-07-29 DIAGNOSIS — I10 Essential (primary) hypertension: Secondary | ICD-10-CM

## 2022-07-29 DIAGNOSIS — Z1211 Encounter for screening for malignant neoplasm of colon: Secondary | ICD-10-CM

## 2022-07-29 LAB — POCT URINALYSIS DIPSTICK
Bilirubin, UA: NEGATIVE
Blood, UA: NEGATIVE
Glucose, UA: NEGATIVE
Ketones, UA: NEGATIVE
Leukocytes, UA: NEGATIVE
Nitrite, UA: NEGATIVE
Protein, UA: NEGATIVE
Spec Grav, UA: 1.015 (ref 1.010–1.025)
Urobilinogen, UA: 0.2 E.U./dL
pH, UA: 6 (ref 5.0–8.0)

## 2022-07-29 MED ORDER — METFORMIN HCL 500 MG PO TABS: ORAL_TABLET | ORAL | 3 refills | Status: DC

## 2022-07-29 NOTE — Progress Notes (Signed)
Established Patient Office Visit     CC/Reason for Visit: Annual preventive exam  HPI: Jared Conner is a 51 y.o. male who is coming in today for the above mentioned reasons. Past Medical History is significant for: Hypertension, hyperlipidemia, type 2 diabetes, morbid obesity.  I have not seen him since August 2022.  He has routine eye and dental care.  He is overdue for colonoscopy.  He is due for flu and second shingles vaccine.  He is feeling well and has no acute concerns or complaints.   Past Medical/Surgical History: Past Medical History:  Diagnosis Date   Diabetes mellitus without complication ([place])    Hypertension    Obese    Ruptured disk 03/02/07   L5-S1 central nonsurgical treatment    No past surgical history on file.  Social History:  reports that he has never smoked. He has never used smokeless tobacco. He reports that he does not drink alcohol and does not use drugs.  Allergies: No Known Allergies  Family History:  Family History  Problem Relation Age of Onset   Hypertension Mother    Allergies Mother    Diabetes Father    Hypertension Father    Allergies Father    Kidney disease Father      Current Outpatient Medications:    ACCU-CHEK SOFTCLIX LANCETS lancets, , Disp: , Rfl:    amLODipine (NORVASC) 2.5 MG tablet, TAKE 1 TABLET(2.5 MG) BY MOUTH DAILY, Disp: 90 tablet, Rfl: 0   blood glucose meter kit and supplies KIT, Dispense based on patient and insurance preference. Use up to three times a day, Disp: 1 each, Rfl: 0   lisinopril-hydrochlorothiazide (ZESTORETIC) 20-25 MG tablet, TAKE 1 TABLET BY MOUTH DAILY, Disp: 90 tablet, Rfl: 0   simvastatin (ZOCOR) 10 MG tablet, TAKE 1 TABLET(10 MG) BY MOUTH DAILY AT 6 PM, Disp: 90 tablet, Rfl: 1   Vitamin D, Ergocalciferol, (DRISDOL) 1.25 MG (50000 UNIT) CAPS capsule, TAKE 1 CAPSULE BY MOUTH EVERY 7 DAYS FOR 12 DOSES, Disp: 12 capsule, Rfl: 0   metFORMIN (GLUCOPHAGE) 500 MG tablet, One half tab every  morning, Disp: 30 tablet, Rfl: 3  Review of Systems:  Constitutional: Denies fever, chills, diaphoresis, appetite change and fatigue.  HEENT: Denies photophobia, eye pain, redness, hearing loss, ear pain, congestion, sore throat, rhinorrhea, sneezing, mouth sores, trouble swallowing, neck pain, neck stiffness and tinnitus.   Respiratory: Denies SOB, DOE, cough, chest tightness,  and wheezing.   Cardiovascular: Denies chest pain, palpitations and leg swelling.  Gastrointestinal: Denies nausea, vomiting, abdominal pain, diarrhea, constipation, blood in stool and abdominal distention.  Genitourinary: Denies dysuria, urgency, frequency, hematuria, flank pain and difficulty urinating.  Endocrine: Denies: hot or cold intolerance, sweats, changes in hair or nails, polyuria, polydipsia. Musculoskeletal: Denies myalgias, back pain, joint swelling, arthralgias and gait problem.  Skin: Denies pallor, rash and wound.  Neurological: Denies dizziness, seizures, syncope, weakness, light-headedness, numbness and headaches.  Hematological: Denies adenopathy. Easy bruising, personal or family bleeding history  Psychiatric/Behavioral: Denies suicidal ideation, mood changes, confusion, nervousness, sleep disturbance and agitation    Physical Exam: Vitals:   07/29/22 1451  BP: 110/80  Pulse: 82  Temp: 98 F (36.7 C)  TempSrc: Oral  SpO2: 98%  Weight: (!) 365 lb 9.6 oz (165.8 kg)  Height: _0  (1.88 m)    Body mass index is 46.94 kg/m.   Constitutional: NAD, calm, comfortable Eyes: PERRL, lids and conjunctivae normal ENMT: Mucous membranes are moist.  Respiratory: clear  to auscultation bilaterally, no wheezing, no crackles. Normal respiratory effort. No accessory muscle use.  Cardiovascular: Regular rate and rhythm, no murmurs / rubs / gallops. No extremity edema.   Psychiatric: Normal judgment and insight. Alert and oriented x 3. Normal mood.    Impression and Plan:  Encounter for preventive  health examination - Plan: PSA, TSH, Vitamin B12, Vitamin B12, TSH, PSA  Screening for malignant neoplasm of colon - Plan: Ambulatory referral to Gastroenterology  Type 2 diabetes mellitus with other specified complication, without long-term current use of insulin ([place]) - Plan: metFORMIN (GLUCOPHAGE) 500 MG tablet, Urine microalbumin-creatinine with uACR, CBC with Differential/Platelet, Comprehensive metabolic panel, Hemoglobin A1c, Lipid panel, Lipid panel, Hemoglobin A1c, Comprehensive metabolic panel, CBC with Differential/Platelet  Dysuria - Plan: POCT urinalysis dipstick  Need for influenza vaccination  Need for shingles vaccine  Essential hypertension  Hyperlipidemia associated with type 2 diabetes mellitus ([place])  Vitamin D deficiency - Plan: VITAMIN D 25 Hydroxy (Vit-D Deficiency, Fractures), VITAMIN D 25 Hydroxy (Vit-D Deficiency, Fractures)  Hypertriglyceridemia   -Recommend routine eye and dental care. -Immunizations: Flu and final shingles vaccines administered today, all other vaccinations are up-to-date -Healthy lifestyle discussed in detail. -Labs to be updated today. -Colon cancer screening: Overdue, GI referral placed -Breast cancer screening: Not applicable -Cervical cancer screening: Not applicable -Lung cancer screening: Never smoker, not applicable -Prostate cancer screening: PSA today -DEXA: Not applicable    Liberti Appleton Isaac Bliss, MD [facility]

## 2022-07-30 LAB — LIPID PANEL
Cholesterol: 174 mg/dL (ref 0–200)
HDL: 37.5 mg/dL — ABNORMAL LOW (ref >39.00)
LDL Cholesterol: 109 mg/dL — ABNORMAL HIGH (ref 0–99)
NonHDL: 136.47
Total CHOL/HDL Ratio: 5
Triglycerides: 139 mg/dL (ref 0.0–149.0)
VLDL: 27.8 mg/dL (ref 0.0–40.0)

## 2022-07-30 LAB — CBC WITH DIFFERENTIAL/PLATELET
Basophils Absolute: 0.1 10*3/uL (ref 0.0–0.1)
Basophils Relative: 1.2 % (ref 0.0–3.0)
Eosinophils Absolute: 0.2 10*3/uL (ref 0.0–0.7)
Eosinophils Relative: 2.4 % (ref 0.0–5.0)
HCT: 39.8 % (ref 39.0–52.0)
Hemoglobin: 13.7 g/dL (ref 13.0–17.0)
Lymphocytes Relative: 15.9 % (ref 12.0–46.0)
Lymphs Abs: 1.7 10*3/uL (ref 0.7–4.0)
MCHC: 34.3 g/dL (ref 30.0–36.0)
MCV: 94.4 fl (ref 78.0–100.0)
Monocytes Absolute: 0.8 10*3/uL (ref 0.1–1.0)
Monocytes Relative: 7.7 % (ref 3.0–12.0)
Neutro Abs: 7.7 10*3/uL (ref 1.4–7.7)
Neutrophils Relative %: 72.8 % (ref 43.0–77.0)
Platelets: 301 10*3/uL (ref 150.0–400.0)
RBC: 4.22 Mil/uL (ref 4.22–5.81)
RDW: 13.3 % (ref 11.5–15.5)
WBC: 10.6 10*3/uL — ABNORMAL HIGH (ref 4.0–10.5)

## 2022-07-30 LAB — COMPREHENSIVE METABOLIC PANEL
ALT: 16 U/L (ref 0–53)
AST: 16 U/L (ref 0–37)
Albumin: 4.3 g/dL (ref 3.5–5.2)
Alkaline Phosphatase: 50 U/L (ref 39–117)
BUN: 16 mg/dL (ref 6–23)
CO2: 28 mEq/L (ref 19–32)
Calcium: 9.7 mg/dL (ref 8.4–10.5)
Chloride: 100 mEq/L (ref 96–112)
Creatinine, Ser: 1.23 mg/dL (ref 0.40–1.50)
GFR: 67.69 mL/min (ref >60.00)
Glucose, Bld: 110 mg/dL — ABNORMAL HIGH (ref 70–99)
Potassium: 4.2 mEq/L (ref 3.5–5.1)
Sodium: 138 mEq/L (ref 135–145)
Total Bilirubin: 0.6 mg/dL (ref 0.2–1.2)
Total Protein: 7.9 g/dL (ref 6.0–8.3)

## 2022-07-30 LAB — VITAMIN B12: Vitamin B-12: 473 pg/mL (ref 211–911)

## 2022-07-30 LAB — MICROALBUMIN / CREATININE URINE RATIO
Creatinine,U: 167.4 mg/dL
Microalb Creat Ratio: 0.4 mg/g (ref 0.0–30.0)
Microalb, Ur: <0.7 mg/dL (ref 0.0–1.9)

## 2022-07-30 LAB — TSH: TSH: 1.31 u[IU]/mL (ref 0.35–5.50)

## 2022-07-30 LAB — HEMOGLOBIN A1C: Hgb A1c MFr Bld: 5.9 % (ref 4.6–6.5)

## 2022-07-30 LAB — VITAMIN D 25 HYDROXY (VIT D DEFICIENCY, FRACTURES): VITD: 28.9 ng/mL — ABNORMAL LOW (ref 30.00–100.00)

## 2022-07-30 LAB — PSA: PSA: 0.2 ng/mL (ref 0.10–4.00)

## 2022-08-05 ENCOUNTER — Other Ambulatory Visit: Payer: Self-pay | Admitting: Internal Medicine

## 2022-08-05 ENCOUNTER — Other Ambulatory Visit: Payer: Self-pay | Admitting: *Deleted

## 2022-08-05 DIAGNOSIS — E1169 Type 2 diabetes mellitus with other specified complication: Secondary | ICD-10-CM

## 2022-08-05 DIAGNOSIS — E559 Vitamin D deficiency, unspecified: Secondary | ICD-10-CM

## 2022-08-05 MED ORDER — VITAMIN D (ERGOCALCIFEROL) 1.25 MG (50000 UNIT) PO CAPS: ORAL_CAPSULE | ORAL | 0 refills | Status: AC

## 2022-08-05 MED ORDER — SIMVASTATIN 20 MG PO TABS: 20.0000 mg | ORAL_TABLET | Freq: Every day | ORAL | 3 refills | Status: DC

## 2022-10-10 ENCOUNTER — Other Ambulatory Visit: Payer: Self-pay | Admitting: Internal Medicine

## 2022-10-10 DIAGNOSIS — I10 Essential (primary) hypertension: Secondary | ICD-10-CM

## 2023-02-04 ENCOUNTER — Other Ambulatory Visit: Payer: Self-pay | Admitting: Internal Medicine

## 2023-02-04 DIAGNOSIS — E559 Vitamin D deficiency, unspecified: Secondary | ICD-10-CM

## 2023-04-23 ENCOUNTER — Other Ambulatory Visit: Payer: Self-pay | Admitting: Internal Medicine

## 2023-04-23 DIAGNOSIS — E1169 Type 2 diabetes mellitus with other specified complication: Secondary | ICD-10-CM

## 2023-04-23 DIAGNOSIS — I10 Essential (primary) hypertension: Secondary | ICD-10-CM

## 2023-07-28 ENCOUNTER — Other Ambulatory Visit: Payer: Self-pay | Admitting: Internal Medicine

## 2023-07-28 DIAGNOSIS — I10 Essential (primary) hypertension: Secondary | ICD-10-CM

## 2023-08-27 ENCOUNTER — Other Ambulatory Visit: Payer: Self-pay | Admitting: Internal Medicine

## 2023-08-27 DIAGNOSIS — I10 Essential (primary) hypertension: Secondary | ICD-10-CM

## 2023-09-28 ENCOUNTER — Other Ambulatory Visit: Payer: Self-pay | Admitting: Internal Medicine

## 2023-09-28 DIAGNOSIS — I10 Essential (primary) hypertension: Secondary | ICD-10-CM

## 2023-10-11 ENCOUNTER — Other Ambulatory Visit: Payer: Self-pay | Admitting: Internal Medicine

## 2023-10-11 DIAGNOSIS — I10 Essential (primary) hypertension: Secondary | ICD-10-CM

## 2023-10-28 ENCOUNTER — Other Ambulatory Visit: Payer: Self-pay | Admitting: Internal Medicine

## 2023-10-28 DIAGNOSIS — E1169 Type 2 diabetes mellitus with other specified complication: Secondary | ICD-10-CM

## 2023-12-01 ENCOUNTER — Other Ambulatory Visit: Payer: Self-pay | Admitting: Internal Medicine

## 2023-12-01 ENCOUNTER — Encounter: Payer: 59 | Admitting: Internal Medicine

## 2023-12-01 DIAGNOSIS — I10 Essential (primary) hypertension: Secondary | ICD-10-CM

## 2023-12-01 DIAGNOSIS — E1169 Type 2 diabetes mellitus with other specified complication: Secondary | ICD-10-CM

## 2023-12-01 NOTE — Telephone Encounter (Signed)
 Copied from CRM [id number]. Topic: Clinical - Medication Refill >> Dec 01, 2023 10:25 AM Drema MATSU wrote: Most Recent Primary Care Visit:  Provider: THEOPHILUS DELMA TULLY CINDERELLA  Department: [facility]  Visit Type: PHYSICAL  Date: 07/29/2022  Medication: ***  Has the patient contacted their pharmacy?  (Agent: If no, request that the patient contact the pharmacy for the refill. If patient does not wish to contact the pharmacy document the reason why and proceed with request.) (Agent: If yes, when and what did the pharmacy advise?)  Is this the correct pharmacy for this prescription?  If no, delete pharmacy and type the correct one.  This is the patient's preferred pharmacy:  [id number] - [place], [place] - [address] AT [facility] OF [address] [place] [address] Phone: [phone] Fax: [phone]   Has the prescription been filled recently?   Is the patient out of the medication?   Has the patient been seen for an appointment in the last year OR does the patient have an upcoming appointment?   Can we respond through MyChart?   Agent: Please be advised that Rx refills may take up to 3 business days. We ask that you follow-up with your pharmacy.

## 2023-12-01 NOTE — Telephone Encounter (Signed)
 Copied from CRM [id number]. Topic: Clinical - Medication Refill >> Dec 01, 2023 10:12 AM Drema MATSU wrote: Most Recent Primary Care Visit:  Provider: THEOPHILUS DELMA KRABBE Y  Department: [facility]  Visit Type: PHYSICAL  Date: 07/29/2022  Medication: simvastatin  (ZOCOR ) 20 MG tablet metFORMIN  (GLUCOPHAGE ) 500 MG tablet lisinopril -hydrochlorothiazide  (ZESTORETIC ) 20-25 MG tablet amLODipine  (NORVASC ) 2.5 MG tablet  Has the patient contacted their pharmacy? No states prescriptions didn't show anymore with pharmacy  (Agent: If no, request that the patient contact the pharmacy for the refill. If patient does not wish to contact the pharmacy document the reason why and proceed with request.) (Agent: If yes, when and what did the pharmacy advise?)  Is this the correct pharmacy for this prescription?  If no, delete pharmacy and type the correct one.  This is the patient's preferred pharmacy:  [id number] - [place], [place] - [address] AT [facility] OF [address] [place] [address] Phone: [phone] Fax: [phone]   Has the prescription been filled recently?   Is the patient out of the medication?   Has the patient been seen for an appointment in the last year OR does the patient have an upcoming appointment?   Can we respond through MyChart?   Agent: Please be advised that Rx refills may take up to 3 business days. We ask that you follow-up with your pharmacy.

## 2023-12-03 ENCOUNTER — Telehealth: Payer: Self-pay

## 2023-12-03 NOTE — Telephone Encounter (Signed)
 Copied from CRM [id number]. Topic: Clinical - Medication Refill >> Dec 01, 2023 10:12 AM Drema MATSU wrote: Most Recent Primary Care Visit:  Provider: THEOPHILUS DELMA KRABBE Y  Department: [facility]  Visit Type: PHYSICAL  Date: 07/29/2022  Medication: simvastatin  (ZOCOR ) 20 MG tablet lisinopril -hydrochlorothiazide  (ZESTORETIC ) 20-25 MG tablet amLODipine  (NORVASC ) 2.5 MG tablet  Has the patient contacted their pharmacy? No states prescriptions didn't show anymore with pharmacy showed no more refills (Agent: If no, request that the patient contact the pharmacy for the refill. If patient does not wish to contact the pharmacy document the reason why and proceed with request.) (Agent: If yes, when and what did the pharmacy advise?)  Is this the correct pharmacy for this prescription? Yes If no, delete pharmacy and type the correct one.  This is the patient's preferred pharmacy:  [id number] - [place], [place] - [address] AT [facility] OF [address] [place] [address] Phone: [phone] Fax: [phone]   Has the prescription been filled recently? No  Is the patient out of the medication? No will be completely out before 01/06  Has the patient been seen for an appointment in the last year OR does the patient have an upcoming appointment? Yes  Can we respond through MyChart? Yes  Agent: Please be advised that Rx refills may take up to 3 business days. We ask that you follow-up with your pharmacy.

## 2023-12-03 NOTE — Telephone Encounter (Signed)
 Copied from CRM [id number]. Topic: Clinical - Medication Refill >> Dec 01, 2023 10:25 AM Drema MATSU wrote: Most Recent Primary Care Visit:  Provider: THEOPHILUS DELMA TULLY CINDERELLA  Department: [facility]  Visit Type: PHYSICAL  Date: 07/29/2022  Medication: metFORMIN  (GLUCOPHAGE ) 500 MG tablet  Has the patient contacted their pharmacy? Yes no more refills (Agent: If no, request that the patient contact the pharmacy for the refill. If patient does not wish to contact the pharmacy document the reason why and proceed with request.) (Agent: If yes, when and what did the pharmacy advise?)  Is this the correct pharmacy for this prescription? Yes If no, delete pharmacy and type the correct one.  This is the patient's preferred pharmacy:  [id number] - [place], [place] - [address] AT [facility] OF [address] [place] [address] Phone: [phone] Fax: [phone]   Has the prescription been filled recently? Yes 12/27  Is the patient out of the medication? No Another week worth of medication  Has the patient been seen for an appointment in the last year OR does the patient have an upcoming appointment? Yes  Can we respond through MyChart? Yes  Agent: Please be advised that Rx refills may take up to 3 business days. We ask that you follow-up with your pharmacy.

## 2023-12-04 MED ORDER — LISINOPRIL-HYDROCHLOROTHIAZIDE 20-25 MG PO TABS: 1.0000 | ORAL_TABLET | Freq: Every day | ORAL | 0 refills | Status: DC

## 2023-12-04 MED ORDER — SIMVASTATIN 20 MG PO TABS: ORAL_TABLET | ORAL | 0 refills | Status: DC

## 2023-12-04 MED ORDER — AMLODIPINE BESYLATE 2.5 MG PO TABS: ORAL_TABLET | ORAL | 0 refills | Status: DC

## 2023-12-04 MED ORDER — METFORMIN HCL 500 MG PO TABS: ORAL_TABLET | ORAL | 0 refills | Status: DC

## 2023-12-09 ENCOUNTER — Encounter: Payer: Self-pay | Admitting: Internal Medicine

## 2023-12-09 DIAGNOSIS — E1169 Type 2 diabetes mellitus with other specified complication: Secondary | ICD-10-CM

## 2023-12-09 DIAGNOSIS — I10 Essential (primary) hypertension: Secondary | ICD-10-CM

## 2023-12-10 ENCOUNTER — Other Ambulatory Visit: Payer: Self-pay | Admitting: Internal Medicine

## 2023-12-10 DIAGNOSIS — I10 Essential (primary) hypertension: Secondary | ICD-10-CM

## 2023-12-10 MED ORDER — METFORMIN HCL 500 MG PO TABS: ORAL_TABLET | ORAL | 0 refills | Status: DC

## 2023-12-10 MED ORDER — LISINOPRIL-HYDROCHLOROTHIAZIDE 20-25 MG PO TABS: 1.0000 | ORAL_TABLET | Freq: Every day | ORAL | 0 refills | Status: DC

## 2023-12-10 MED ORDER — AMLODIPINE BESYLATE 2.5 MG PO TABS: ORAL_TABLET | ORAL | 0 refills | Status: DC

## 2023-12-10 MED ORDER — SIMVASTATIN 20 MG PO TABS: ORAL_TABLET | ORAL | 0 refills | Status: DC

## 2023-12-27 ENCOUNTER — Other Ambulatory Visit: Payer: Self-pay | Admitting: Internal Medicine

## 2024-02-04 ENCOUNTER — Ambulatory Visit: Payer: 59 | Admitting: Internal Medicine

## 2024-02-04 ENCOUNTER — Encounter: Payer: Self-pay | Admitting: Internal Medicine

## 2024-02-04 VITALS — BP 120/78 | HR 80 | Temp 98.3°F | Ht 74.5 in | Wt 371.7 lb

## 2024-02-04 DIAGNOSIS — I10 Essential (primary) hypertension: Secondary | ICD-10-CM

## 2024-02-04 DIAGNOSIS — E663 Overweight: Secondary | ICD-10-CM

## 2024-02-04 DIAGNOSIS — E785 Hyperlipidemia, unspecified: Secondary | ICD-10-CM | POA: Diagnosis not present

## 2024-02-04 DIAGNOSIS — E1169 Type 2 diabetes mellitus with other specified complication: Secondary | ICD-10-CM | POA: Diagnosis not present

## 2024-02-04 DIAGNOSIS — E559 Vitamin D deficiency, unspecified: Secondary | ICD-10-CM

## 2024-02-04 DIAGNOSIS — Z7984 Long term (current) use of oral hypoglycemic drugs: Secondary | ICD-10-CM

## 2024-02-04 DIAGNOSIS — Z114 Encounter for screening for human immunodeficiency virus [HIV]: Secondary | ICD-10-CM

## 2024-02-04 DIAGNOSIS — Z1159 Encounter for screening for other viral diseases: Secondary | ICD-10-CM

## 2024-02-04 DIAGNOSIS — Z0000 Encounter for general adult medical examination without abnormal findings: Secondary | ICD-10-CM | POA: Diagnosis not present

## 2024-02-04 DIAGNOSIS — Z1211 Encounter for screening for malignant neoplasm of colon: Secondary | ICD-10-CM

## 2024-02-04 DIAGNOSIS — E139 Other specified diabetes mellitus without complications: Secondary | ICD-10-CM

## 2024-02-04 DIAGNOSIS — E119 Type 2 diabetes mellitus without complications: Secondary | ICD-10-CM | POA: Insufficient documentation

## 2024-02-04 LAB — MICROALBUMIN / CREATININE URINE RATIO
Creatinine,U: 188.5 mg/dL
Microalb Creat Ratio: 4.1 mg/g (ref 0.0–30.0)
Microalb, Ur: 0.8 mg/dL (ref 0.0–1.9)

## 2024-02-04 LAB — COMPREHENSIVE METABOLIC PANEL
ALT: 17 U/L (ref 0–53)
AST: 15 U/L (ref 0–37)
Albumin: 4.2 g/dL (ref 3.5–5.2)
Alkaline Phosphatase: 59 U/L (ref 39–117)
BUN: 19 mg/dL (ref 6–23)
CO2: 30 meq/L (ref 19–32)
Calcium: 9.5 mg/dL (ref 8.4–10.5)
Chloride: 99 meq/L (ref 96–112)
Creatinine, Ser: 1.15 mg/dL (ref 0.40–1.50)
GFR: 72.61 mL/min (ref >60.00)
Glucose, Bld: 158 mg/dL — ABNORMAL HIGH (ref 70–99)
Potassium: 4 meq/L (ref 3.5–5.1)
Sodium: 138 meq/L (ref 135–145)
Total Bilirubin: 0.5 mg/dL (ref 0.2–1.2)
Total Protein: 7.6 g/dL (ref 6.0–8.3)

## 2024-02-04 LAB — TSH: TSH: 2.63 u[IU]/mL (ref 0.35–5.50)

## 2024-02-04 LAB — CBC WITH DIFFERENTIAL/PLATELET
Basophils Absolute: 0.1 10*3/uL (ref 0.0–0.1)
Basophils Relative: 0.8 % (ref 0.0–3.0)
Eosinophils Absolute: 0.2 10*3/uL (ref 0.0–0.7)
Eosinophils Relative: 2.3 % (ref 0.0–5.0)
HCT: 40.2 % (ref 39.0–52.0)
Hemoglobin: 13.6 g/dL (ref 13.0–17.0)
Lymphocytes Relative: 17.1 % (ref 12.0–46.0)
Lymphs Abs: 1.7 10*3/uL (ref 0.7–4.0)
MCHC: 33.9 g/dL (ref 30.0–36.0)
MCV: 94.4 fl (ref 78.0–100.0)
Monocytes Absolute: 0.8 10*3/uL (ref 0.1–1.0)
Monocytes Relative: 7.8 % (ref 3.0–12.0)
Neutro Abs: 7.3 10*3/uL (ref 1.4–7.7)
Neutrophils Relative %: 72 % (ref 43.0–77.0)
Platelets: 297 10*3/uL (ref 150.0–400.0)
RBC: 4.25 Mil/uL (ref 4.22–5.81)
RDW: 13.2 % (ref 11.5–15.5)
WBC: 10.2 10*3/uL (ref 4.0–10.5)

## 2024-02-04 LAB — LIPID PANEL
Cholesterol: 139 mg/dL (ref 0–200)
HDL: 37.5 mg/dL — ABNORMAL LOW (ref >39.00)
LDL Cholesterol: 68 mg/dL (ref 0–99)
NonHDL: 101.2
Total CHOL/HDL Ratio: 4
Triglycerides: 165 mg/dL — ABNORMAL HIGH (ref 0.0–149.0)
VLDL: 33 mg/dL (ref 0.0–40.0)

## 2024-02-04 LAB — HEMOGLOBIN A1C: Hgb A1c MFr Bld: 6.3 % (ref 4.6–6.5)

## 2024-02-04 LAB — VITAMIN D 25 HYDROXY (VIT D DEFICIENCY, FRACTURES): VITD: 31.64 ng/mL (ref 30.00–100.00)

## 2024-02-04 LAB — VITAMIN B12: Vitamin B-12: 479 pg/mL (ref 211–911)

## 2024-02-04 LAB — PSA: PSA: 0.26 ng/mL (ref 0.10–4.00)

## 2024-02-04 NOTE — Assessment & Plan Note (Signed)
 Check A1c, continue metformin.  We discussed adding a GLP-1 which will help with weight loss but he would like to defer at this time.

## 2024-02-04 NOTE — Assessment & Plan Note (Signed)
 Check levels today.

## 2024-02-04 NOTE — Progress Notes (Signed)
 Established Patient Office Visit     CC/Reason for Visit: Annual preventive exam and follow-up chronic medical conditions  HPI: Jared Conner is a 53 y.o. male who is coming in today for the above mentioned reasons. Past Medical History is significant for: Hypertension, hyperlipidemia, type 2 diabetes, morbid obesity.  I have not seen him since the summer 2023.  Feeling well without concerns or complaints.  He is overdue for diabetic eye exam as well as for age-appropriate prostate and colon cancer screening.  He tells me he will be getting his flu and COVID vaccines today.  All other immunizations are up-to-date.   Past Medical/Surgical History: Past Medical History:  Diagnosis Date   Diabetes mellitus without complication (HCC)    Hypertension    Obese    Ruptured disk 03/02/07   L5-S1 central nonsurgical treatment    History reviewed. No pertinent surgical history.  Social History:  reports that he has never smoked. He has never used smokeless tobacco. He reports that he does not drink alcohol and does not use drugs.  Allergies: No Known Allergies  Family History:  Family History  Problem Relation Age of Onset   Hypertension Mother    Allergies Mother    Diabetes Father    Hypertension Father    Allergies Father    Kidney disease Father      Current Outpatient Medications:    ACCU-CHEK SOFTCLIX LANCETS lancets, , Disp: , Rfl:    amLODipine (NORVASC) 2.5 MG tablet, TAKE 1 TABLET(2.5 MG) BY MOUTH DAILY, Disp: 90 tablet, Rfl: 0   Apple Cider Vinegar 188 MG CAPS, Take by mouth., Disp: , Rfl:    blood glucose meter kit and supplies KIT, Dispense based on patient and insurance preference. Use up to three times a day, Disp: 1 each, Rfl: 0   lisinopril-hydrochlorothiazide (ZESTORETIC) 20-25 MG tablet, Take 1 tablet by mouth daily., Disp: 90 tablet, Rfl: 0   metFORMIN (GLUCOPHAGE) 500 MG tablet, TAKE 1/2 TABLET BY MOUTH EVERY MORNING, Disp: 90 tablet, Rfl: 0    simvastatin (ZOCOR) 20 MG tablet, TAKE 1 TABLET(20 MG) BY MOUTH AT BEDTIME, Disp: 90 tablet, Rfl: 0   Vitamin D, Ergocalciferol, (DRISDOL) 1.25 MG (50000 UNIT) CAPS capsule, TAKE 1 CAPSULE BY MOUTH EVERY 7 DAYS FOR 12 DOSES, Disp: 12 capsule, Rfl: 0  Review of Systems:  Negative unless indicated in HPI.   Physical Exam: Vitals:   02/04/24 0804 02/04/24 0811  BP: 120/80 120/78  Pulse: 80   Temp: 98.3 F (36.8 C)   TempSrc: Oral   SpO2: 97%   Weight: (!) 371 lb 11.2 oz (168.6 kg)   Height: 6' 2.5" (1.892 m)     Body mass index is 47.09 kg/m.   Physical Exam Vitals reviewed.  Constitutional:      General: He is not in acute distress.    Appearance: Normal appearance. He is obese. He is not ill-appearing, toxic-appearing or diaphoretic.  HENT:     Head: Normocephalic.     Right Ear: Tympanic membrane, ear canal and external ear normal. There is no impacted cerumen.     Left Ear: Tympanic membrane, ear canal and external ear normal. There is no impacted cerumen.     Nose: Nose normal.     Mouth/Throat:     Mouth: Mucous membranes are moist.     Pharynx: Oropharynx is clear. No oropharyngeal exudate or posterior oropharyngeal erythema.  Eyes:     General: No scleral icterus.  Right eye: No discharge.        Left eye: No discharge.     Conjunctiva/sclera: Conjunctivae normal.     Pupils: Pupils are equal, round, and reactive to light.  Neck:     Vascular: No carotid bruit.  Cardiovascular:     Rate and Rhythm: Normal rate and regular rhythm.     Pulses: Normal pulses.     Heart sounds: Normal heart sounds.  Pulmonary:     Effort: Pulmonary effort is normal. No respiratory distress.     Breath sounds: Normal breath sounds.  Abdominal:     General: Abdomen is flat. Bowel sounds are normal.     Palpations: Abdomen is soft.  Musculoskeletal:        General: Normal range of motion.     Cervical back: Normal range of motion.  Skin:    General: Skin is warm and dry.   Neurological:     General: No focal deficit present.     Mental Status: He is alert and oriented to person, place, and time. Mental status is at baseline.  Psychiatric:        Mood and Affect: Mood normal.        Behavior: Behavior normal.        Thought Content: Thought content normal.        Judgment: Judgment normal.     Flowsheet Row Office Visit from 02/04/2024 in [facility] [place]  PHQ-9 Total Score 2        Impression and Plan:  Encounter for preventive health examination -     PSA; Future  Essential hypertension Assessment & Plan: Well-controlled on current.   Type 2 diabetes mellitus with other specified complication, without long-term current use of insulin (HCC) Assessment & Plan: Check A1c, continue metformin.  We discussed adding a GLP-1 which will help with weight loss but he would like to defer at this time.  Orders: -     Hemoglobin A1c; Future -     CBC with Differential/Platelet; Future -     Comprehensive metabolic panel; Future -     Microalbumin / creatinine urine ratio; Future  Hyperlipidemia associated with type 2 diabetes mellitus (HCC) Assessment & Plan: Check lipids today.  Goal LDL less than 70, probably closer to 50.  Orders: -     Lipid panel; Future  Vitamin D deficiency Assessment & Plan: Check levels today.  Orders: -     VITAMIN D 25 Hydroxy (Vit-D Deficiency, Fractures); Future  Morbid obesity (HCC) Assessment & Plan: -Discussed healthy lifestyle, including increased physical activity and better food choices to promote weight loss.  Declines use of GLP-1 at this time.   Orders: -     TSH; Future -     Vitamin B12; Future  Diabetes 1.5, managed as type 2 (HCC)  Screening for malignant neoplasm of colon -     Ambulatory referral to Gastroenterology  Encounter for hepatitis C screening test for low risk patient -     Hepatitis C antibody; Future  Encounter for screening for HIV -     HIV  Antibody (routine testing w rflx); Future   -Recommend routine eye and dental care. -Healthy lifestyle discussed in detail. -Labs to be updated today. -Prostate cancer screening: PSA today Health Maintenance  Topic Date Due   Pneumococcal Vaccination (1 of 2 - PCV) Never done   HIV Screening  Never done   Hepatitis C Screening  Never done  Colon Cancer Screening  Never done   Hemoglobin A1C  01/29/2023   Eye exam for diabetics  03/02/2023   Yearly kidney function blood test for diabetes  07/30/2023   Yearly kidney health urinalysis for diabetes  07/30/2023   COVID-19 Vaccine (5 - 2024-25 season) 08/02/2023   Flu Shot  02/29/2024*   Complete foot exam   02/03/2025   DTaP/Tdap/Td vaccine (3 - Td or Tdap) 11/03/2029   Zoster (Shingles) Vaccine  Completed   HPV Vaccine  Aged Out  *Topic was postponed. The date shown is not the original due date.     -Overdue for initial colon cancer screening, send referral. -He will call to schedule ophthalmology follow-up.     Chaya Jan, MD [facility]

## 2024-02-04 NOTE — Assessment & Plan Note (Signed)
-  Discussed healthy lifestyle, including increased physical activity and better food choices to promote weight loss.  Declines use of GLP-1 at this time.

## 2024-02-04 NOTE — Assessment & Plan Note (Signed)
 Well-controlled on current.

## 2024-02-04 NOTE — Assessment & Plan Note (Signed)
 Check lipids today.  Goal LDL less than 70, probably closer to 50.

## 2024-02-05 LAB — HIV ANTIBODY (ROUTINE TESTING W REFLEX): HIV 1&2 Ab, 4th Generation: NONREACTIVE

## 2024-02-05 LAB — HEPATITIS C ANTIBODY: Hepatitis C Ab: NONREACTIVE

## 2024-02-09 ENCOUNTER — Encounter: Payer: Self-pay | Admitting: Internal Medicine

## 2024-02-25 ENCOUNTER — Encounter: Payer: Self-pay | Admitting: Gastroenterology

## 2024-04-01 ENCOUNTER — Ambulatory Visit (AMBULATORY_SURGERY_CENTER): Admitting: *Deleted

## 2024-04-01 ENCOUNTER — Encounter: Payer: Self-pay | Admitting: Gastroenterology

## 2024-04-01 VITALS — Ht 74.5 in | Wt 371.0 lb

## 2024-04-01 DIAGNOSIS — Z1211 Encounter for screening for malignant neoplasm of colon: Secondary | ICD-10-CM

## 2024-04-01 MED ORDER — NA SULFATE-K SULFATE-MG SULF 17.5-3.13-1.6 GM/177ML PO SOLN
1.0000 | Freq: Once | ORAL | 0 refills | Status: AC
Start: 2024-04-01 — End: 2024-04-01

## 2024-04-01 NOTE — Progress Notes (Signed)
 Pt's name and DOB verified at the beginning of the pre-visit wit 2 identifiers  Pt denies any difficulty with ambulating,sitting, laying down or rolling side to side  Pt has no issues with ambulation   Pt has no issues moving head neck or swallowing  No egg or soy allergy known to patient   No issues known to pt with past sedation with any surgeries or procedures  Pt denies having issues being intubated  No FH of Malignant Hyperthermia  Pt is not on diet pills or shots  Pt is not on home 02   Pt is not on blood thinners   Pt has frequent issues with constipation RN instructed pt to use Miralax per bottles instructions a week before prep days. Pt states they will  Pt is not on dialysis  Pt denise any abnormal heart rhythms   Pt denies any upcoming cardiac testing  Patient's chart reviewed by Rogena Class CNRA prior to pre-visit and patient appropriate for the [facility].  Pre-visit completed and red dot placed by patient's name on their procedure day (on provider's schedule).    Chart not reviewed by CRNA prior to [facility]  Visit by phone  Pt states weight is 371 lb  IInstructions reviewed. Pt given, [facility] main # and MD on call # prior to instructions.  Pt states understanding of instructions. Instructed to review again prior to procedure. Pt states they will.

## 2024-04-03 ENCOUNTER — Other Ambulatory Visit: Payer: Self-pay | Admitting: Internal Medicine

## 2024-04-03 DIAGNOSIS — I10 Essential (primary) hypertension: Secondary | ICD-10-CM

## 2024-04-08 ENCOUNTER — Encounter (HOSPITAL_COMMUNITY): Payer: Self-pay

## 2024-04-15 ENCOUNTER — Telehealth: Payer: Self-pay | Admitting: Gastroenterology

## 2024-04-15 ENCOUNTER — Ambulatory Visit: Admitting: Gastroenterology

## 2024-04-15 ENCOUNTER — Encounter: Payer: Self-pay | Admitting: Gastroenterology

## 2024-04-15 VITALS — BP 155/85 | HR 70 | Temp 98.2°F | Resp 14 | Ht 74.0 in | Wt 371.0 lb

## 2024-04-15 DIAGNOSIS — K635 Polyp of colon: Secondary | ICD-10-CM | POA: Diagnosis not present

## 2024-04-15 DIAGNOSIS — D124 Benign neoplasm of descending colon: Secondary | ICD-10-CM

## 2024-04-15 DIAGNOSIS — Z1211 Encounter for screening for malignant neoplasm of colon: Secondary | ICD-10-CM

## 2024-04-15 DIAGNOSIS — K6389 Other specified diseases of intestine: Secondary | ICD-10-CM | POA: Diagnosis not present

## 2024-04-15 DIAGNOSIS — K5730 Diverticulosis of large intestine without perforation or abscess without bleeding: Secondary | ICD-10-CM | POA: Diagnosis not present

## 2024-04-15 MED ORDER — SODIUM CHLORIDE 0.9 % IV SOLN
500.0000 mL | Freq: Once | INTRAVENOUS | Status: DC
Start: 2024-04-15 — End: 2024-04-15

## 2024-04-15 NOTE — Progress Notes (Signed)
 Called to room to assist during endoscopic procedure.  Patient ID and intended procedure confirmed with present staff. Received instructions for my participation in the procedure from the performing physician.

## 2024-04-15 NOTE — Progress Notes (Signed)
 Pt A/O x 3, gd SR's, pleased with anesthesia, report to RN

## 2024-04-15 NOTE — Op Note (Signed)
 [facility] Patient Name: Jared Conner Procedure Date: 04/15/2024 11:33 AM MRN: [id number] Endoscopist: Ace Abu L. Dominic Friendly , MD, [id number] Age: 53 Referring MD:  Date of Birth: Aug 01, 1971 Gender: Male Account #: [id number] Procedure:                Colonoscopy Indications:              Screening for colorectal malignant neoplasm, This                            is the patient's first colonoscopy Medicines:                Monitored Anesthesia Care Procedure:                Pre-Anesthesia Assessment:                           - Prior to the procedure, a History and Physical                            was performed, and patient medications and                            allergies were reviewed. The patient's tolerance of                            previous anesthesia was also reviewed. The risks                            and benefits of the procedure and the sedation                            options and risks were discussed with the patient.                            All questions were answered, and informed consent                            was obtained. Prior Anticoagulants: The patient has                            taken no anticoagulant or antiplatelet agents. ASA                            Grade Assessment: III - A patient with severe                            systemic disease. After reviewing the risks and                            benefits, the patient was deemed in satisfactory                            condition to undergo the procedure.  After obtaining informed consent, the colonoscope                            was passed under direct vision. Throughout the                            procedure, the patient's blood pressure, pulse, and                            oxygen saturations were monitored continuously. The                            Olympus Scope SN [id number] was introduced through the                            anus and advanced  to the the cecum, identified by                            appendiceal orifice and ileocecal valve. The                            colonoscopy was performed with difficulty due to a                            redundant colon, significant looping and the                            patient's body habitus. Successful completion of                            the procedure was aided by changing the patient to                            a semi-prone position, using manual pressure and                            straightening and shortening the scope to obtain                            bowel loop reduction. The patient tolerated the                            procedure well. The quality of the bowel                            preparation was good. The ileocecal valve,                            appendiceal orifice, and rectum were photographed. Scope In: 11:39:54 AM Scope Out: 12:00:29 PM Scope Withdrawal Time: 0 hours 10 minutes 39 seconds  Total Procedure Duration: 0 hours 20 minutes 35 seconds  Findings:                 The perianal and digital  rectal examinations were                            normal.                           Repeat examination of right colon under NBI                            performed.                           A 6 mm polyp was found in the proximal descending                            colon. The polyp was mucous-capped and sessile. The                            polyp was removed with a cold snare. Resection and                            retrieval were complete.                           An area of melanosis was found in the entire colon.                           Multiple diverticula were found in the left colon.                           The exam was otherwise without abnormality on                            direct and retroflexion views. Complications:            No immediate complications. Estimated Blood Loss:     Estimated blood loss was  minimal. Impression:               - One 6 mm polyp in the proximal descending colon,                            removed with a cold snare. Resected and retrieved.                           - Melanosis in the colon.                           - Diverticulosis in the left colon.                           - The examination was otherwise normal on direct                            and retroflexion views. Recommendation:           - Patient has a contact number available for  emergencies. The signs and symptoms of potential                            delayed complications were discussed with the                            patient. Return to normal activities tomorrow.                            Written discharge instructions were provided to the                            patient.                           - Resume previous diet.                           - Continue present medications.                           - Await pathology results.                           - Repeat colonoscopy is recommended for                            surveillance. The colonoscopy date will be                            determined after pathology results from today's                            exam become available for review. Azure Barrales L. Dominic Friendly, MD 04/15/2024 12:05:18 PM This report has been signed electronically.

## 2024-04-15 NOTE — Patient Instructions (Signed)
 Educational handout provided to patient related to Polyps and Diverticulosis  Resume previous diet  Continue present medications  Awaiting pathology results  YOU HAD AN ENDOSCOPIC PROCEDURE TODAY AT THE [facility]:   Refer to the procedure report that was given to you for any specific questions about what was found during the examination.  If the procedure report does not answer your questions, please call your gastroenterologist to clarify.  If you requested that your care partner not be given the details of your procedure findings, then the procedure report has been included in a sealed envelope for you to review at your convenience later.  YOU SHOULD EXPECT: Some feelings of bloating in the abdomen. Passage of more gas than usual.  Walking can help get rid of the air that was put into your GI tract during the procedure and reduce the bloating. If you had a lower endoscopy (such as a colonoscopy or flexible sigmoidoscopy) you may notice spotting of blood in your stool or on the toilet paper. If you underwent a bowel prep for your procedure, you may not have a normal bowel movement for a few days.  Please Note:  You might notice some irritation and congestion in your nose or some drainage.  This is from the oxygen used during your procedure.  There is no need for concern and it should clear up in a day or so.  SYMPTOMS TO REPORT IMMEDIATELY:  Following lower endoscopy (colonoscopy or flexible sigmoidoscopy):  Excessive amounts of blood in the stool  Significant tenderness or worsening of abdominal pains  Swelling of the abdomen that is new, acute  Fever of 100F or higher  For urgent or emergent issues, a gastroenterologist can be reached at any hour by calling [phone]. Do not use MyChart messaging for urgent concerns.    DIET:  We do recommend a small meal at first, but then you may proceed to your regular diet.  Drink plenty of fluids but you should avoid alcoholic  beverages for 24 hours.  ACTIVITY:  You should plan to take it easy for the rest of today and you should NOT DRIVE or use heavy machinery until tomorrow (because of the sedation medicines used during the test).    FOLLOW UP: Our staff will call the number listed on your records the next business day following your procedure.  We will call around 7:15- 8:00 am to check on you and address any questions or concerns that you may have regarding the information given to you following your procedure. If we do not reach you, we will leave a message.     If any biopsies were taken you will be contacted by phone or by letter within the next 1-3 weeks.  Please call us at [phone] if you have not heard about the biopsies in 3 weeks.    SIGNATURES/CONFIDENTIALITY: You and/or your care partner have signed paperwork which will be entered into your electronic medical record.  These signatures attest to the fact that that the information above on your After Visit Summary has been reviewed and is understood.  Full responsibility of the confidentiality of this discharge information lies with you and/or your care-partner.

## 2024-04-15 NOTE — Telephone Encounter (Signed)
 Tried calling patient back but no answer. Message left on patients voicemail
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
## 2024-04-15 NOTE — Progress Notes (Signed)
 Pt's states no medical or surgical changes since previsit or office visit.

## 2024-04-15 NOTE — Telephone Encounter (Signed)
 Inbound call from patient stating he stomach is still very upset. Requesting a call back to discuss further. Please advise, thank you.

## 2024-04-18 ENCOUNTER — Telehealth: Payer: Self-pay

## 2024-04-18 NOTE — Telephone Encounter (Signed)
  Follow up Call-     04/15/2024   10:51 AM  Call back number  Post procedure Call Back phone  # [phone]  Permission to leave phone message Yes     Patient questions:  Do you have a fever, pain , or abdominal swelling? No. Pain Score  0 *  Have you tolerated food without any problems? Yes.    Have you been able to return to your normal activities? Yes.    Do you have any questions about your discharge instructions: Diet   No. Medications  No. Follow up visit  No.  Do you have questions or concerns about your Care? No.  Actions: * If pain score is 4 or above: No action needed, pain <4.

## 2024-04-19 LAB — SURGICAL PATHOLOGY

## 2024-04-21 ENCOUNTER — Ambulatory Visit: Payer: Self-pay | Admitting: Gastroenterology

## 2024-05-02 ENCOUNTER — Other Ambulatory Visit: Payer: Self-pay | Admitting: Internal Medicine

## 2024-05-02 DIAGNOSIS — I10 Essential (primary) hypertension: Secondary | ICD-10-CM

## 2024-05-05 ENCOUNTER — Other Ambulatory Visit: Payer: Self-pay | Admitting: Internal Medicine

## 2024-06-06 ENCOUNTER — Ambulatory Visit: Admitting: Internal Medicine

## 2024-06-06 DIAGNOSIS — E1169 Type 2 diabetes mellitus with other specified complication: Secondary | ICD-10-CM

## 2024-06-13 ENCOUNTER — Ambulatory Visit: Admitting: Internal Medicine

## 2024-06-13 DIAGNOSIS — E1169 Type 2 diabetes mellitus with other specified complication: Secondary | ICD-10-CM

## 2024-06-16 ENCOUNTER — Ambulatory Visit: Admitting: Internal Medicine

## 2024-06-22 ENCOUNTER — Ambulatory Visit: Admitting: Internal Medicine

## 2024-06-29 ENCOUNTER — Ambulatory Visit: Admitting: Internal Medicine

## 2024-07-04 ENCOUNTER — Other Ambulatory Visit: Payer: Self-pay | Admitting: Internal Medicine

## 2024-07-04 DIAGNOSIS — E1169 Type 2 diabetes mellitus with other specified complication: Secondary | ICD-10-CM

## 2024-07-13 ENCOUNTER — Ambulatory Visit: Admitting: Internal Medicine

## 2024-07-18 ENCOUNTER — Ambulatory Visit: Admitting: Internal Medicine

## 2024-07-21 ENCOUNTER — Ambulatory Visit: Admitting: Internal Medicine

## 2024-07-27 ENCOUNTER — Ambulatory Visit: Admitting: Internal Medicine

## 2024-07-28 ENCOUNTER — Ambulatory Visit: Admitting: Internal Medicine

## 2024-08-02 ENCOUNTER — Ambulatory Visit: Admitting: Internal Medicine

## 2024-08-03 ENCOUNTER — Ambulatory Visit: Admitting: Internal Medicine

## 2024-08-03 ENCOUNTER — Encounter: Payer: Self-pay | Admitting: Internal Medicine

## 2024-08-03 VITALS — BP 110/78 | HR 75 | Temp 98.2°F | Wt 366.7 lb

## 2024-08-03 DIAGNOSIS — E1169 Type 2 diabetes mellitus with other specified complication: Secondary | ICD-10-CM | POA: Diagnosis not present

## 2024-08-03 DIAGNOSIS — I10 Essential (primary) hypertension: Secondary | ICD-10-CM | POA: Diagnosis not present

## 2024-08-03 DIAGNOSIS — E559 Vitamin D deficiency, unspecified: Secondary | ICD-10-CM

## 2024-08-03 DIAGNOSIS — E785 Hyperlipidemia, unspecified: Secondary | ICD-10-CM

## 2024-08-03 LAB — POCT GLYCOSYLATED HEMOGLOBIN (HGB A1C): Hemoglobin A1C: 6.6 % — AB (ref 4.0–5.6)

## 2024-08-03 NOTE — Assessment & Plan Note (Signed)
 Levels were within range in March.

## 2024-08-03 NOTE — Assessment & Plan Note (Signed)
 Well-controlled on current.

## 2024-08-03 NOTE — Assessment & Plan Note (Signed)
 A1c is stable at 6.6, continue metformin .  We discussed adding a GLP-1 which will help with weight loss but he would like to defer at this time.

## 2024-08-03 NOTE — Assessment & Plan Note (Signed)
 Discussed healthy lifestyle, including increased physical activity and better food choices to promote weight loss.

## 2024-08-03 NOTE — Assessment & Plan Note (Signed)
 At goal, continue simvastatin
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
## 2024-09-30 ENCOUNTER — Other Ambulatory Visit: Payer: Self-pay | Admitting: Internal Medicine

## 2024-09-30 DIAGNOSIS — I10 Essential (primary) hypertension: Secondary | ICD-10-CM

## 2024-10-31 ENCOUNTER — Other Ambulatory Visit: Payer: Self-pay | Admitting: Internal Medicine

## 2024-10-31 DIAGNOSIS — I10 Essential (primary) hypertension: Secondary | ICD-10-CM

## 2024-11-28 ENCOUNTER — Other Ambulatory Visit: Payer: Self-pay | Admitting: Internal Medicine
